# Patient Record
Sex: Female | Born: 2000 | Race: Asian | Hispanic: No | Marital: Single | Smoking: Former smoker
Health system: Southern US, Community
[De-identification: ages and names within clinical notes are randomized; demographics above are authoritative.]

## PROBLEM LIST (undated history)

## (undated) DIAGNOSIS — Z789 Other specified health status: Secondary | ICD-10-CM

## (undated) HISTORY — PX: NO PAST SURGERIES: SHX2092

---
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
## 2018-03-24 LAB — OB RESULTS CONSOLE GC/CHLAMYDIA
Chlamydia: NEGATIVE
Gonorrhea: NEGATIVE

## 2018-03-24 LAB — OB RESULTS CONSOLE RUBELLA ANTIBODY, IGM: Rubella: IMMUNE

## 2018-03-24 LAB — OB RESULTS CONSOLE ABO/RH: RH Type: POSITIVE

## 2018-03-24 LAB — OB RESULTS CONSOLE ANTIBODY SCREEN: Antibody Screen: NEGATIVE

## 2018-03-24 LAB — OB RESULTS CONSOLE HEPATITIS B SURFACE ANTIGEN: Hepatitis B Surface Ag: NEGATIVE

## 2018-03-24 LAB — OB RESULTS CONSOLE RPR: RPR: NONREACTIVE

## 2018-03-24 LAB — OB RESULTS CONSOLE HIV ANTIBODY (ROUTINE TESTING): HIV: NONREACTIVE

## 2018-04-14 ENCOUNTER — Encounter (HOSPITAL_COMMUNITY): Payer: Self-pay

## 2018-04-22 ENCOUNTER — Other Ambulatory Visit (HOSPITAL_COMMUNITY): Payer: Self-pay | Admitting: Nurse Practitioner

## 2018-04-22 DIAGNOSIS — O358XX0 Maternal care for other (suspected) fetal abnormality and damage, not applicable or unspecified: Secondary | ICD-10-CM

## 2018-04-22 DIAGNOSIS — IMO0002 Reserved for concepts with insufficient information to code with codable children: Secondary | ICD-10-CM

## 2018-04-22 DIAGNOSIS — Z3689 Encounter for other specified antenatal screening: Secondary | ICD-10-CM

## 2018-04-22 DIAGNOSIS — Z3A23 23 weeks gestation of pregnancy: Secondary | ICD-10-CM

## 2018-05-06 ENCOUNTER — Encounter (HOSPITAL_COMMUNITY): Payer: Self-pay

## 2018-05-12 ENCOUNTER — Encounter (HOSPITAL_COMMUNITY): Payer: Self-pay

## 2018-05-12 ENCOUNTER — Ambulatory Visit (HOSPITAL_COMMUNITY)
Admission: RE | Admit: 2018-05-12 | Discharge: 2018-05-12 | Disposition: A | Discharge status: Home / Self Care | Payer: Medicaid Other | Source: Ambulatory Visit | Attending: Nurse Practitioner | Admitting: Nurse Practitioner

## 2018-05-12 ENCOUNTER — Other Ambulatory Visit (HOSPITAL_COMMUNITY): Payer: Self-pay | Admitting: *Deleted

## 2018-05-12 DIAGNOSIS — Z3689 Encounter for other specified antenatal screening: Secondary | ICD-10-CM

## 2018-05-12 DIAGNOSIS — Z362 Encounter for other antenatal screening follow-up: Secondary | ICD-10-CM

## 2018-05-12 DIAGNOSIS — Z3687 Encounter for antenatal screening for uncertain dates: Secondary | ICD-10-CM | POA: Diagnosis not present

## 2018-05-12 DIAGNOSIS — O359XX0 Maternal care for (suspected) fetal abnormality and damage, unspecified, not applicable or unspecified: Secondary | ICD-10-CM

## 2018-05-12 DIAGNOSIS — O358XX0 Maternal care for other (suspected) fetal abnormality and damage, not applicable or unspecified: Secondary | ICD-10-CM

## 2018-05-12 DIAGNOSIS — Z3A23 23 weeks gestation of pregnancy: Secondary | ICD-10-CM | POA: Diagnosis not present

## 2018-05-12 DIAGNOSIS — IMO0002 Reserved for concepts with insufficient information to code with codable children: Secondary | ICD-10-CM

## 2018-06-09 ENCOUNTER — Ambulatory Visit (HOSPITAL_COMMUNITY)
Admission: RE | Admit: 2018-06-09 | Discharge: 2018-06-09 | Disposition: A | Discharge status: Home / Self Care | Payer: Medicaid Other | Source: Ambulatory Visit | Attending: Nurse Practitioner | Admitting: Nurse Practitioner

## 2018-06-09 DIAGNOSIS — O359XX0 Maternal care for (suspected) fetal abnormality and damage, unspecified, not applicable or unspecified: Secondary | ICD-10-CM

## 2018-06-09 DIAGNOSIS — Z3A27 27 weeks gestation of pregnancy: Secondary | ICD-10-CM | POA: Insufficient documentation

## 2018-06-09 DIAGNOSIS — O358XX0 Maternal care for other (suspected) fetal abnormality and damage, not applicable or unspecified: Secondary | ICD-10-CM | POA: Insufficient documentation

## 2018-06-09 DIAGNOSIS — Z362 Encounter for other antenatal screening follow-up: Secondary | ICD-10-CM

## 2018-08-07 ENCOUNTER — Other Ambulatory Visit (HOSPITAL_COMMUNITY): Payer: Self-pay | Admitting: *Deleted

## 2018-08-07 DIAGNOSIS — Z364 Encounter for antenatal screening for fetal growth retardation: Secondary | ICD-10-CM

## 2018-08-08 ENCOUNTER — Ambulatory Visit (HOSPITAL_COMMUNITY)
Admission: RE | Admit: 2018-08-08 | Discharge: 2018-08-08 | Disposition: A | Discharge status: Home / Self Care | Payer: Medicaid Other | Source: Ambulatory Visit | Attending: Nurse Practitioner | Admitting: Nurse Practitioner

## 2018-08-08 ENCOUNTER — Other Ambulatory Visit (HOSPITAL_COMMUNITY): Payer: Self-pay | Admitting: Obstetrics and Gynecology

## 2018-08-08 DIAGNOSIS — O26843 Uterine size-date discrepancy, third trimester: Secondary | ICD-10-CM | POA: Diagnosis not present

## 2018-08-08 DIAGNOSIS — Z3A36 36 weeks gestation of pregnancy: Secondary | ICD-10-CM | POA: Diagnosis not present

## 2018-08-08 DIAGNOSIS — O359XX0 Maternal care for (suspected) fetal abnormality and damage, unspecified, not applicable or unspecified: Secondary | ICD-10-CM

## 2018-08-08 DIAGNOSIS — O365930 Maternal care for other known or suspected poor fetal growth, third trimester, not applicable or unspecified: Secondary | ICD-10-CM

## 2018-08-08 DIAGNOSIS — Z364 Encounter for antenatal screening for fetal growth retardation: Secondary | ICD-10-CM | POA: Insufficient documentation

## 2018-08-08 DIAGNOSIS — Z362 Encounter for other antenatal screening follow-up: Secondary | ICD-10-CM | POA: Diagnosis not present

## 2018-08-11 ENCOUNTER — Telehealth: Payer: Self-pay | Admitting: Obstetrics and Gynecology

## 2018-08-11 ENCOUNTER — Other Ambulatory Visit (HOSPITAL_COMMUNITY): Payer: Self-pay | Admitting: *Deleted

## 2018-08-11 ENCOUNTER — Encounter: Payer: Self-pay | Admitting: Obstetrics and Gynecology

## 2018-08-11 DIAGNOSIS — O365990 Maternal care for other known or suspected poor fetal growth, unspecified trimester, not applicable or unspecified: Secondary | ICD-10-CM | POA: Insufficient documentation

## 2018-08-11 DIAGNOSIS — O365930 Maternal care for other known or suspected poor fetal growth, third trimester, not applicable or unspecified: Secondary | ICD-10-CM

## 2018-08-11 NOTE — Telephone Encounter (Signed)
OB Telephone Note Dr. Grace BushyBooker recommends IOL at any point b/w 37-39wks. [facility] Shaw [facility] and set up MD consult visit with provider for tomorrow morning to go over delivery plan. Patient already has rpt mfm bpp and dopplers for tomorrow afternoon.  Leah Copaharlie [name], [name] MD Attending Center [facility] 08/11/2018 Time: 1253pm

## 2018-08-12 ENCOUNTER — Encounter (HOSPITAL_COMMUNITY): Payer: Self-pay

## 2018-08-12 ENCOUNTER — Encounter (HOSPITAL_COMMUNITY): Payer: Self-pay | Admitting: *Deleted

## 2018-08-12 ENCOUNTER — Other Ambulatory Visit: Payer: Self-pay | Admitting: Advanced Practice Midwife

## 2018-08-12 ENCOUNTER — Ambulatory Visit (HOSPITAL_COMMUNITY)
Admission: RE | Admit: 2018-08-12 | Discharge: 2018-08-12 | Disposition: A | Discharge status: Home / Self Care | Payer: Medicaid Other | Source: Ambulatory Visit | Attending: Nurse Practitioner | Admitting: Nurse Practitioner

## 2018-08-12 ENCOUNTER — Telehealth (HOSPITAL_COMMUNITY): Payer: Self-pay | Admitting: *Deleted

## 2018-08-12 VITALS — BP 105/75 | HR 81

## 2018-08-12 DIAGNOSIS — O365930 Maternal care for other known or suspected poor fetal growth, third trimester, not applicable or unspecified: Secondary | ICD-10-CM | POA: Insufficient documentation

## 2018-08-12 DIAGNOSIS — Z3A36 36 weeks gestation of pregnancy: Secondary | ICD-10-CM | POA: Diagnosis not present

## 2018-08-12 DIAGNOSIS — O365990 Maternal care for other known or suspected poor fetal growth, unspecified trimester, not applicable or unspecified: Secondary | ICD-10-CM

## 2018-08-12 DIAGNOSIS — O26849 Uterine size-date discrepancy, unspecified trimester: Secondary | ICD-10-CM

## 2018-08-12 DIAGNOSIS — O26843 Uterine size-date discrepancy, third trimester: Secondary | ICD-10-CM | POA: Diagnosis not present

## 2018-08-12 NOTE — Procedures (Signed)
Leah Price 06/29/2001 929w4d  Fetus A Non-Stress Test Interpretation for 08/12/18  Indication: IUGR  Fetal Heart Rate A Mode: External Baseline Rate (A): 130 bpm Variability: Moderate Accelerations: 15 x 15 Decelerations: None Multiple birth?: No  Uterine Activity Mode: Toco Contraction Frequency (min): irreg UC noted Contraction Duration (sec): 60-100 Contraction Quality: Mild Resting Tone Palpated: Relaxed Resting Time: Adequate  Interpretation (Fetal Testing) Nonstress Test Interpretation: Reactive Comments: FHR tracing rev'd by Dr. Grace BushyBooker

## 2018-08-12 NOTE — Telephone Encounter (Signed)
Preadmission screen  

## 2018-08-15 ENCOUNTER — Inpatient Hospital Stay (HOSPITAL_COMMUNITY): Payer: Medicaid Other | Admitting: Anesthesiology

## 2018-08-15 ENCOUNTER — Other Ambulatory Visit (HOSPITAL_COMMUNITY): Payer: Medicaid Other

## 2018-08-15 ENCOUNTER — Inpatient Hospital Stay (HOSPITAL_COMMUNITY)
Admission: RE | Admit: 2018-08-15 | Discharge: 2018-08-17 | DRG: 807 | Disposition: A | Discharge status: Home / Self Care | Payer: Medicaid Other | Attending: Obstetrics & Gynecology | Admitting: Obstetrics & Gynecology

## 2018-08-15 ENCOUNTER — Other Ambulatory Visit: Payer: Self-pay

## 2018-08-15 ENCOUNTER — Encounter (HOSPITAL_COMMUNITY): Payer: Self-pay

## 2018-08-15 DIAGNOSIS — O365930 Maternal care for other known or suspected poor fetal growth, third trimester, not applicable or unspecified: Principal | ICD-10-CM | POA: Diagnosis present

## 2018-08-15 DIAGNOSIS — Z87891 Personal history of nicotine dependence: Secondary | ICD-10-CM

## 2018-08-15 DIAGNOSIS — O365990 Maternal care for other known or suspected poor fetal growth, unspecified trimester, not applicable or unspecified: Secondary | ICD-10-CM | POA: Diagnosis present

## 2018-08-15 DIAGNOSIS — Z8759 Personal history of other complications of pregnancy, childbirth and the puerperium: Secondary | ICD-10-CM | POA: Diagnosis present

## 2018-08-15 DIAGNOSIS — Z3A37 37 weeks gestation of pregnancy: Secondary | ICD-10-CM | POA: Diagnosis not present

## 2018-08-15 HISTORY — DX: Other specified health status: Z78.9

## 2018-08-15 LAB — CBC
HCT: 34.8 % — ABNORMAL LOW (ref 36.0–46.0)
Hemoglobin: 11.7 g/dL — ABNORMAL LOW (ref 12.0–15.0)
MCH: 28.7 pg (ref 26.0–34.0)
MCHC: 33.6 g/dL (ref 30.0–36.0)
MCV: 85.5 fL (ref 80.0–100.0)
Platelets: 255 10*3/uL (ref 150–400)
RBC: 4.07 MIL/uL (ref 3.87–5.11)
RDW: 12.3 % (ref 11.5–15.5)
WBC: 11.9 10*3/uL — AB (ref 4.0–10.5)

## 2018-08-15 LAB — TYPE AND SCREEN
ABO/RH(D): B POS
ANTIBODY SCREEN: NEGATIVE

## 2018-08-15 LAB — RPR: RPR Ser Ql: NONREACTIVE

## 2018-08-15 LAB — ABO/RH: ABO/RH(D): B POS

## 2018-08-15 LAB — GROUP B STREP BY PCR: Group B strep by PCR: NEGATIVE

## 2018-08-15 MED ORDER — SODIUM CHLORIDE 0.9 % IV SOLN
5.0000 10*6.[IU] | Freq: Once | INTRAVENOUS | Status: AC
  Administered 2018-08-15: 5 10*6.[IU] via INTRAVENOUS
  Filled 2018-08-15: qty 5

## 2018-08-15 MED ORDER — EPHEDRINE 5 MG/ML INJ
10.0000 mg | INTRAVENOUS | Status: DC | PRN
  Filled 2018-08-15: qty 2

## 2018-08-15 MED ORDER — WITCH HAZEL-GLYCERIN EX PADS: 1.0000 "application " | MEDICATED_PAD | CUTANEOUS | Status: DC | PRN

## 2018-08-15 MED ORDER — DIPHENHYDRAMINE HCL 25 MG PO CAPS: 25.0000 mg | ORAL_CAPSULE | Freq: Four times a day (QID) | ORAL | Status: DC | PRN

## 2018-08-15 MED ORDER — SIMETHICONE 80 MG PO CHEW: 80.0000 mg | CHEWABLE_TABLET | ORAL | Status: DC | PRN

## 2018-08-15 MED ORDER — LACTATED RINGERS IV SOLN
INTRAVENOUS | Status: DC
  Administered 2018-08-15 (×3): via INTRAVENOUS

## 2018-08-15 MED ORDER — DIPHENHYDRAMINE HCL 50 MG/ML IJ SOLN: 12.5000 mg | INTRAMUSCULAR | Status: DC | PRN

## 2018-08-15 MED ORDER — DIBUCAINE 1 % RE OINT: 1.0000 "application " | TOPICAL_OINTMENT | RECTAL | Status: DC | PRN

## 2018-08-15 MED ORDER — LIDOCAINE HCL (PF) 1 % IJ SOLN
30.0000 mL | INTRAMUSCULAR | Status: AC | PRN
  Administered 2018-08-15: 30 mL via SUBCUTANEOUS
  Filled 2018-08-15: qty 30

## 2018-08-15 MED ORDER — ACETAMINOPHEN 325 MG PO TABS: 650.0000 mg | ORAL_TABLET | ORAL | Status: DC | PRN

## 2018-08-15 MED ORDER — SOD CITRATE-CITRIC ACID 500-334 MG/5ML PO SOLN: 30.0000 mL | ORAL | Status: DC | PRN

## 2018-08-15 MED ORDER — OXYTOCIN 40 UNITS IN LACTATED RINGERS INFUSION - SIMPLE MED: 2.5000 [IU]/h | INTRAVENOUS | Status: DC

## 2018-08-15 MED ORDER — TERBUTALINE SULFATE 1 MG/ML IJ SOLN
0.2500 mg | Freq: Once | INTRAMUSCULAR | Status: DC | PRN
  Filled 2018-08-15: qty 1

## 2018-08-15 MED ORDER — LIDOCAINE HCL (PF) 1 % IJ SOLN
INTRAMUSCULAR | Status: DC | PRN
  Administered 2018-08-15: 8 mL via EPIDURAL

## 2018-08-15 MED ORDER — TETANUS-DIPHTH-ACELL PERTUSSIS 5-2.5-18.5 LF-MCG/0.5 IM SUSP: 0.5000 mL | Freq: Once | INTRAMUSCULAR | Status: DC

## 2018-08-15 MED ORDER — ONDANSETRON HCL 4 MG/2ML IJ SOLN: 4.0000 mg | INTRAMUSCULAR | Status: DC | PRN

## 2018-08-15 MED ORDER — ZOLPIDEM TARTRATE 5 MG PO TABS: 5.0000 mg | ORAL_TABLET | Freq: Every evening | ORAL | Status: DC | PRN

## 2018-08-15 MED ORDER — BENZOCAINE-MENTHOL 20-0.5 % EX AERO
1.0000 "application " | INHALATION_SPRAY | CUTANEOUS | Status: DC | PRN
  Administered 2018-08-16: 1 via TOPICAL
  Filled 2018-08-15: qty 56

## 2018-08-15 MED ORDER — PRENATAL MULTIVITAMIN CH
1.0000 | ORAL_TABLET | Freq: Every day | ORAL | Status: DC
  Administered 2018-08-16 – 2018-08-17 (×2): 1 via ORAL
  Filled 2018-08-15 (×2): qty 1

## 2018-08-15 MED ORDER — COCONUT OIL OIL: 1.0000 "application " | TOPICAL_OIL | Status: DC | PRN

## 2018-08-15 MED ORDER — FENTANYL 2.5 MCG/ML BUPIVACAINE 1/10 % EPIDURAL INFUSION (WH - ANES)
14.0000 mL/h | INTRAMUSCULAR | Status: DC | PRN
  Administered 2018-08-15: 14 mL/h via EPIDURAL
  Filled 2018-08-15: qty 100

## 2018-08-15 MED ORDER — PENICILLIN G 3 MILLION UNITS IVPB - SIMPLE MED: 3.0000 10*6.[IU] | INTRAVENOUS | Status: DC

## 2018-08-15 MED ORDER — IBUPROFEN 600 MG PO TABS
600.0000 mg | ORAL_TABLET | Freq: Four times a day (QID) | ORAL | Status: DC
  Administered 2018-08-16 – 2018-08-17 (×5): 600 mg via ORAL
  Filled 2018-08-15 (×7): qty 1

## 2018-08-15 MED ORDER — SENNOSIDES-DOCUSATE SODIUM 8.6-50 MG PO TABS
2.0000 | ORAL_TABLET | ORAL | Status: DC
  Administered 2018-08-15 – 2018-08-17 (×2): 2 via ORAL
  Filled 2018-08-15 (×2): qty 2

## 2018-08-15 MED ORDER — LACTATED RINGERS IV SOLN: 500.0000 mL | INTRAVENOUS | Status: DC | PRN

## 2018-08-15 MED ORDER — PHENYLEPHRINE 40 MCG/ML (10ML) SYRINGE FOR IV PUSH (FOR BLOOD PRESSURE SUPPORT)
80.0000 ug | PREFILLED_SYRINGE | INTRAVENOUS | Status: DC | PRN
  Filled 2018-08-15 (×2): qty 10

## 2018-08-15 MED ORDER — LACTATED RINGERS IV SOLN: 500.0000 mL | Freq: Once | INTRAVENOUS | Status: DC

## 2018-08-15 MED ORDER — PHENYLEPHRINE 40 MCG/ML (10ML) SYRINGE FOR IV PUSH (FOR BLOOD PRESSURE SUPPORT)
80.0000 ug | PREFILLED_SYRINGE | INTRAVENOUS | Status: DC | PRN
  Filled 2018-08-15: qty 10

## 2018-08-15 MED ORDER — MISOPROSTOL 50MCG HALF TABLET
50.0000 ug | ORAL_TABLET | ORAL | Status: DC | PRN
  Administered 2018-08-15 (×2): 50 ug via BUCCAL
  Filled 2018-08-15 (×3): qty 1

## 2018-08-15 MED ORDER — ONDANSETRON HCL 4 MG/2ML IJ SOLN: 4.0000 mg | Freq: Four times a day (QID) | INTRAMUSCULAR | Status: DC | PRN

## 2018-08-15 MED ORDER — OXYTOCIN BOLUS FROM INFUSION
500.0000 mL | Freq: Once | INTRAVENOUS | Status: AC
  Administered 2018-08-15: 500 mL via INTRAVENOUS

## 2018-08-15 MED ORDER — OXYTOCIN 40 UNITS IN LACTATED RINGERS INFUSION - SIMPLE MED
1.0000 m[IU]/min | INTRAVENOUS | Status: DC
  Administered 2018-08-15: 2 m[IU]/min via INTRAVENOUS
  Filled 2018-08-15: qty 1000

## 2018-08-15 MED ORDER — OXYCODONE-ACETAMINOPHEN 5-325 MG PO TABS: 2.0000 | ORAL_TABLET | ORAL | Status: DC | PRN

## 2018-08-15 MED ORDER — OXYCODONE-ACETAMINOPHEN 5-325 MG PO TABS: 1.0000 | ORAL_TABLET | ORAL | Status: DC | PRN

## 2018-08-15 MED ORDER — ONDANSETRON HCL 4 MG PO TABS: 4.0000 mg | ORAL_TABLET | ORAL | Status: DC | PRN

## 2018-08-15 NOTE — H&P (Addendum)
OBSTETRIC ADMISSION HISTORY AND PHYSICAL  Leah Price is a 17 y.o. female G1P0 with IUP at 3057w0d presenting for IOL due to IUGR. She reports +FMs. No LOF, VB, blurry vision, headaches, peripheral edema, or RUQ pain. She plans on breastfeeding. She requests POPs for birth control.  Dating: By L/19 --->  Estimated Date of Delivery: 09/05/18  Sono:   @[redacted]w[redacted]d , CWD, normal anatomy, cephalic presentation 12/6: efw 2114gm, <10%, ac<3%, afi 11, s/d 92%, bpp 8/8  Prenatal History/Complications: HbE Trait (asymptomatic)  Past Medical History: Past Medical History:  Diagnosis Date  . Medical history non-contributory     Past Surgical History: Past Surgical History:  Procedure Laterality Date  . NO PAST SURGERIES      Obstetrical History: OB History    Gravida  1   Para      Term      Preterm      AB      Living  0     SAB      TAB      Ectopic      Multiple      Live Births              Social History: Social History   Socioeconomic History  . Marital status: Single    Spouse name: Not on file  . Number of children: Not on file  . Years of education: Not on file  . Highest education level: Not on file  Occupational History  . Not on file  Social Needs  . Financial resource strain: Not hard at all  . Food insecurity:    Worry: Never true    Inability: Never true  . Transportation needs:    Medical: No    Non-medical: Not on file  Tobacco Use  . Smoking status: Former Games developermoker  . Smokeless tobacco: Never Used  Substance and Sexual Activity  . Alcohol use: Not Currently  . Drug use: Never  . Sexual activity: Yes    Birth control/protection: None  Lifestyle  . Physical activity:    Days per week: Not on file    Minutes per session: Not on file  . Stress: Not at all  Relationships  . Social connections:    Talks on phone: Not on file    Gets together: Not on file    Attends religious service: Not on file    Active member of club or  organization: Not on file    Attends meetings of clubs or organizations: Not on file    Relationship status: Not on file  Other Topics Concern  . Not on file  Social History Narrative  . Not on file    Family History: Family History  Problem Relation Age of Onset  . Miscarriages / [place]birth[place] Mother   . Hyperlipidemia Maternal Grandmother   . ADD / ADHD Neg Hx   . Alcohol abuse Neg Hx   . Anxiety disorder Neg Hx   . Arthritis Neg Hx   . Asthma Neg Hx   . Birth defects Neg Hx   . Cancer Neg Hx   . COPD Neg Hx   . Depression Neg Hx   . Diabetes Neg Hx   . Drug abuse Neg Hx   . Hearing loss Neg Hx   . Heart disease Neg Hx   . Hypertension Neg Hx   . Intellectual disability Neg Hx   . Kidney disease Neg Hx   . Learning disabilities Neg Hx   .  Obesity Neg Hx   . Stroke Neg Hx   . Vision loss Neg Hx   . Varicose Veins Neg Hx     Allergies: No Known Allergies  Medications Prior to Admission  Medication Sig Dispense Refill Last Dose  . Prenatal Vit w/Fe-Methylfol-FA (PNV PO) Take by mouth.   08/14/2018 at Unknown time  . ibuprofen (ADVIL,MOTRIN) 600 MG tablet Take 1 tablet (600 mg total) by mouth every 6 (six) hours as needed. (Patient not taking: Reported on 08/12/2018) 30 tablet 0 Not Taking     Review of Systems   All systems reviewed and negative except as stated in HPI  Blood pressure 121/69, pulse 86, temperature 98.1 F (36.7 C), temperature source Oral, resp. rate 18. General appearance: alert, cooperative and no distress Lungs: regular rate and effort Heart: regular rate  Abdomen: soft, non-tender Extremities: Homans sign is negative, no sign of DVT Presentation: cephalic Fetal monitoringBaseline: 140s bpm, reactive with moderate variability Uterine activity: Spontaneous, non-painful contractions q27min Dilation: Fingertip Effacement (%): 20 Station: -3 Exam by:: Barnicle RN   Prenatal labs: ABO, Rh: B/Positive/-- (07/22 0000) Antibody: Negative  (07/22 0000) Rubella: Immune (07/22 0000) RPR: Nonreactive (07/22 0000)  HBsAg: Negative (07/22 0000)  HIV: Non-reactive (07/22 0000)  GBS:  Unknown 1 hr GTT Negative  Prenatal Transfer Tool  Maternal Diabetes: No Genetic Screening: Normal Maternal Ultrasounds/Referrals: Abnormal:  Findings:   IUGR, Isolated EIF (echogenic intracardiac focus) Fetal Ultrasounds or other Referrals:  Referred to Materal Fetal Medicine  Maternal Substance Abuse:  No Significant Maternal Medications:  None Significant Maternal Lab Results: None  Results for orders placed or performed during the hospital encounter of 08/15/18 (from the past 24 hour(s))  CBC   Collection Time: 08/15/18 12:59 AM  Result Value Ref Range   WBC 11.9 (H) 4.0 - 10.5 K/uL   RBC 4.07 3.87 - 5.11 MIL/uL   Hemoglobin 11.7 (L) 12.0 - 15.0 g/dL   HCT 96.2 (L) 95.2 - 84.1 %   MCV 85.5 80.0 - 100.0 fL   MCH 28.7 26.0 - 34.0 pg   MCHC 33.6 30.0 - 36.0 g/dL   RDW 32.4 40.1 - 02.7 %   Platelets 255 150 - 400 K/uL    Patient Active Problem List   Diagnosis Date Noted  . IUGR (intrauterine growth restriction) affecting care of mother 08/15/2018  . Antepartum fetal growth retardation 08/11/2018    Assessment: Leah Price is a 17 y.o. G1P0 at [redacted]w[redacted]d here for IOL due to IUGR.  1. Labor: Not in active labor. 2. FWB: Reactive, reassuring FHT. 3. Pain: None at this time. Epidural desired later. 4. GBS: Unknown   Plan: 1. Labor: Cytotec Q4H. FB placement once cervix favorable. 2. FWB: Continue to monitor. 3. Pain: Fentanyl PRN. Epidural when requested. 4. GBS: PCR pending. PCN ordered.     Awilda Metro, MD  08/15/2018, 1:36 AM   I personally saw and evaluated the patient, performing the key elements of the service. I developed and verified the management plan that is described in the resident's/student's note, and I agree with the content with my edits above.  Cathie Beams, CNM 08/15/2018 7:19 AM

## 2018-08-15 NOTE — Anesthesia Postprocedure Evaluation (Signed)
Anesthesia Post Note  Patient: Vinnie Shaddock  Procedure(s) Performed: AN AD HOC LABOR EPIDURAL     Patient location during evaluation: Mother Baby Anesthesia Type: Epidural Level of consciousness: awake and alert and oriented Pain management: satisfactory to patient Vital Signs Assessment: post-procedure vital signs reviewed and stable Respiratory status: respiratory function stable Cardiovascular status: stable Postop Assessment: no headache, no backache, epidural receding, patient able to bend at knees, no signs of nausea or vomiting and adequate PO intake Anesthetic complications: no    Last Vitals:  Vitals:   08/15/18 1630 08/15/18 1731  BP: 113/75 113/71  Pulse: 83 83  Resp: 17 17  Temp: 36.9 C 37.1 C  SpO2:      Last Pain:  Vitals:   08/15/18 1731  TempSrc: Oral  PainSc:    Pain Goal: Patients Stated Pain Goal: 4 (08/15/18 0145)               Karleen DolphinFUSSELL,[place] Rollyson

## 2018-08-15 NOTE — Lactation Note (Addendum)
This note was copied from a baby's chart. Lactation Consultation Note  Patient Name: Leah Nena JordanSopheara Monestime ZOXWR'UToday's Date: 08/15/2018 Reason for consult: Initial assessment;Primapara;1st time breastfeeding;Infant < 6lbs;Early term 37-38.6wks  P1 mother whose infant is now 483 hours old.  This is an ETI at 37+0 weeks weighing < 6 lbs.  Reviewed the ETI with parents, emphasizing the need to feed at least 8-12 times/24 hours or sooner if baby shows cues, waking baby every 3 hours if he does not self awaken, tips to keep him awake during feeds, hand expression and finger feeding.  Explained the importance of hand washing for all, not kissing baby and allowing baby time for quiet time and STS as much as possible.  Educated parents on the need to begin pumping for stimulation and to help increase mother's milk supply.  Mother willing to begin pumping.  Initiated the DEBP and reviewed pump set up, assembly, disassembly and cleaning of pump parts.  Encouraged mother to do hand expression before/after feedings to help increase supply.  Colostrum container provided for any EBM she may obtain with pumping.  Milk storage times reviewed.  Informed parents that baby will probably have to be supplemented, at some point, with higher calorie formula and they are in agreement with this when necessary.  Mother's RN will provided instructions when needed.    Mom made aware of O/P services, breastfeeding support groups, community resources, and our phone # for post-discharge questions. Family and visitors present.  Reminded mother to call RN/LC for latch assistance at the next feed and as needed.  Mother verbalized understanding of the importance of feeding every three hours.  [facility]   Maternal Data Formula Feeding for Exclusion: No Has patient been taught Hand Expression?: Yes Does the patient have breastfeeding experience prior to this delivery?: No  Feeding    LATCH Score                    Interventions    Lactation Tools Discussed/Used Tools: Pump WIC Program: Yes Pump Review: Setup, frequency, and cleaning;Milk Storage Initiated by:: Laureen OchsBeth Keylan Costabile Date initiated:: 08/15/18   Consult Status Consult Status: Follow-up Date: 08/16/18 Follow-up type: In-patient    Dora SimsBeth R Johari Pinney 08/15/2018, 6:29 PM

## 2018-08-15 NOTE — Addendum Note (Signed)
Addendum  created 08/15/18 1826 by Graciela HusbandsFussell, Keshav Winegar O, CRNA   Charge Capture section accepted, Clinical Note Signed

## 2018-08-15 NOTE — Anesthesia Pain Management Evaluation Note (Signed)
  CRNA Pain Management Visit Note  Patient: Leah Price, 17 y.o., female  "Hello I am a [facility]. We have an anesthesia team available at all times to provide care throughout the hospital, including epidural management and anesthesia for C-section. I don't know your plan for the delivery whether it a natural birth, water birth, IV sedation, nitrous supplementation, doula or epidural, but we want to meet your pain goals."   1.Was your pain managed to your expectations on prior hospitalizations?   No prior hospitalizations  2.What is your expectation for pain management during this hospitalization?     Epidural  3.How can we help you reach that goal? epidural Record the patient's initial score and the patient's pain goal.   Pain: 4  Pain Goal: 6 [facility] wants you to be able to say your pain was always managed very well.  Verdon Ferrante 08/15/2018

## 2018-08-15 NOTE — Progress Notes (Addendum)
Vitals:   08/15/18 0715 08/15/18 0726  BP:  103/62  Pulse:  79  Resp: 18   Temp: 98 F (36.7 C)    FHR Cat 1, ctx q 1.5-2 minutes.  2nd cytotec at 0515.  Cx 1/50/-2, foley inserted by Dr. Bluford KaufmannA Pfieffer.

## 2018-08-15 NOTE — Progress Notes (Signed)
LABOR PROGRESS NOTE  Leah Price is a 17 y.o. G1P0 at 6147w0d  admitted for IOL 2/2 IUGR  Subjective: Patient is resting comfortably with FOB at bedside. She denies feeling contractions or pressure or leakage.   Objective: BP 103/66   Pulse 90   Temp 97.7 F (36.5 C) (Oral)   Resp 18   Ht 5\' 5"  (1.651 m)   Wt 63.9 kg   SpO2 100%   BMI 23.45 kg/m  or  Vitals:   08/15/18 1020 08/15/18 1030 08/15/18 1056 08/15/18 1100  BP: 109/69 109/66  103/66  Pulse: 92 75  90  Resp:   18   Temp:      TempSrc:      SpO2: 100%     Weight:      Height:        ~1115 Dilation: 5.5 Effacement (%): 50 Cervical Position: Anterior Station: 0 Presentation: Vertex Exam by:: Dr. Marita KansasAnderson FHT: baseline rate 125, moderate varibility, 15x15 acel, variable decel Toco: 2-3 minutes  Labs: Lab Results  Component Value Date   WBC 11.9 (H) 08/15/2018   HGB 11.7 (L) 08/15/2018   HCT 34.8 (L) 08/15/2018   MCV 85.5 08/15/2018   PLT 255 08/15/2018    Patient Active Problem List   Diagnosis Date Noted  . IUGR (intrauterine growth restriction) affecting care of mother 08/15/2018  . Antepartum fetal growth retardation 08/11/2018    Assessment / Plan: 17 y.o. G1P0 at 7847w0d here for IOL 2/2 IUGR  Labor: patient is comfortable. Membranes intact. Cervix is slowly progressing to 5.5cm and more abundant on pt right side. Ordering to begin pitocin at this time to augment and requested nurse to position patient on right side. Fetal Wellbeing:  Category I, occasional variables Pain Control:  Epidural in place Anticipated MOD:  vaginal  Chelsey Anderson DO 08/15/2018, 11:29 AM

## 2018-08-15 NOTE — Progress Notes (Signed)
Patient: Leah Price MRN: [id number]  GBS status: negative, IAP given NA  Patient is a 17 y.o. now G1P1 s/p NSVD at 3862w0d, who was admitted for IOL 2/2 IUGR. SROM 0h 875m prior to delivery with clear fluid.    Delivery Note At 2:46 PM a viable and healthy female was delivered via Vaginal, Spontaneous (Presentation:vertex; ROA).  APGAR: 8, 9; weight 4 lb 15 oz (2240 g).   Placenta status: delivered spontaneous, intact.  Cord: 3 vessel with the following complications: none.  Cord pH: pending  Anesthesia:  Epidural in place Episiotomy: None Lacerations:  Mild abrasions Suture Repair: none Est. Blood Loss (mL): 50  Mom to postpartum.  Baby to Couplet care / Skin to Skin.  Leah Price 08/15/2018, 4:37 PM   Head delivered ROA. No nuchal cord present. Shoulder and body delivered in usual fashion. Infant with spontaneous cry, placed on mother's abdomen, dried and bulb suctioned. Cord clamped x 2 after 1-minute delay, and cut by family member. Cord blood drawn. Placenta delivered spontaneously with gentle cord traction. Fundus firm with massage and Pitocin. Perineum inspected and found to have mild superficial abrasions which were found to be hemostatic.

## 2018-08-15 NOTE — Anesthesia Postprocedure Evaluation (Signed)
Anesthesia Post Note  Patient: Leah Price  Procedure(s) Performed: AN AD HOC LABOR EPIDURAL     Patient location during evaluation: Mother Baby Anesthesia Type: Epidural Level of consciousness: awake and alert Pain management: pain level controlled Vital Signs Assessment: post-procedure vital signs reviewed and stable Respiratory status: spontaneous breathing, nonlabored ventilation and respiratory function stable Cardiovascular status: stable Postop Assessment: no headache, no backache and epidural receding Anesthetic complications: no    Last Vitals:  Vitals:   08/15/18 1545 08/15/18 1630  BP:  113/75  Pulse:  83  Resp: 15 17  Temp:  36.9 C  SpO2:      Last Pain:  Vitals:   08/15/18 1630  TempSrc: Oral  PainSc: 0-No pain   Pain Goal: Patients Stated Pain Goal: 4 (08/15/18 0145)               Deolinda Frid

## 2018-08-15 NOTE — Anesthesia Preprocedure Evaluation (Signed)
Anesthesia Evaluation  Patient identified by MRN, date of birth, ID band Patient awake    Reviewed: Allergy & Precautions, H&P , NPO status , Patient's Chart, lab work & pertinent test results, reviewed documented beta blocker date and time   Airway Mallampati: I  TM Distance: >3 FB Neck ROM: full    Dental no notable dental hx.    Pulmonary neg pulmonary ROS, former smoker,    Pulmonary exam normal breath sounds clear to auscultation       Cardiovascular negative cardio ROS Normal cardiovascular exam Rhythm:regular Rate:Normal     Neuro/Psych negative neurological ROS  negative psych ROS   GI/Hepatic negative GI ROS, Neg liver ROS,   Endo/Other  negative endocrine ROS  Renal/GU negative Renal ROS  negative genitourinary   Musculoskeletal   Abdominal   Peds  Hematology negative hematology ROS (+)   Anesthesia Other Findings   Reproductive/Obstetrics (+) Pregnancy                             Anesthesia Physical Anesthesia Plan  ASA: II  Anesthesia Plan: Epidural   Post-op Pain Management:    Induction:   PONV Risk Score and Plan:   Airway Management Planned:   Additional Equipment:   Intra-op Plan:   Post-operative Plan:   Informed Consent: I have reviewed the patients History and Physical, chart, labs and discussed the procedure including the risks, benefits and alternatives for the proposed anesthesia with the patient or authorized representative who has indicated his/her understanding and acceptance.       Plan Discussed with:   Anesthesia Plan Comments:         Anesthesia Quick Evaluation  

## 2018-08-15 NOTE — Progress Notes (Signed)
LABOR PROGRESS NOTE  Leah Price is a 17 y.o. G1P0 at 4141w0d  admitted for IOL 2/2 IUGR  Subjective: Patient is having increased pain with contractions described as sharp.   Objective: BP 119/70   Pulse 85   Temp 97.9 F (36.6 C) (Oral)   Resp 16   Ht 5\' 5"  (1.651 m)   Wt 63.9 kg   SpO2 100%   BMI 23.45 kg/m  or  Vitals:   08/15/18 1230 08/15/18 1302 08/15/18 1330 08/15/18 1400  BP: 102/61 (!) 99/57 106/66 119/70  Pulse: 76 81 71 85  Resp: 18 16 16 16   Temp:  97.9 F (36.6 C)    TempSrc:  Oral    SpO2:      Weight:      Height:        ~1400 Dilation: 6.5 Effacement (%): 90 Cervical Position: Anterior Station: Plus 1 Presentation: Vertex Exam by:: Ferne CoeS Earl RNC FHT: baseline rate 130, moderate varibility, none acel, variable decel Toco: 1-2 minutes moderate  Labs: Lab Results  Component Value Date   WBC 11.9 (H) 08/15/2018   HGB 11.7 (L) 08/15/2018   HCT 34.8 (L) 08/15/2018   MCV 85.5 08/15/2018   PLT 255 08/15/2018    Patient Active Problem List   Diagnosis Date Noted  . IUGR (intrauterine growth restriction) affecting care of mother 08/15/2018  . Antepartum fetal growth retardation 08/11/2018    Assessment / Plan: 17 y.o. G1P0 at 2641w0d here for IOL 2/2 IUGR  Labor: progressing well on pitocin. Cervix ~6.5 Fetal Wellbeing:  Category I Pain Control:  Epidural in place and patient states is not adequate. Requesting other pain medications Anticipated MOD:  vaginal  Terry Abila DO 08/15/2018, 2:15 PM

## 2018-08-15 NOTE — Anesthesia Procedure Notes (Signed)
Epidural Patient location during procedure: OB Start time: 08/15/2018 9:50 AM End time: 08/15/2018 9:55 AM  Staffing Anesthesiologist: Bethena Midgetddono, Adarsh Mundorf, MD  Preanesthetic Checklist Completed: patient identified, site marked, surgical consent, pre-op evaluation, timeout performed, IV checked, risks and benefits discussed and monitors and equipment checked  Epidural Patient position: sitting Prep: site prepped and draped and DuraPrep Patient monitoring: continuous pulse ox and blood pressure Approach: midline Location: L3-L4 Injection technique: LOR air  Needle:  Needle type: Tuohy  Needle gauge: 17 G Needle length: 9 cm and 9 Needle insertion depth: 6 cm Catheter type: closed end flexible Catheter size: 19 Gauge Catheter at skin depth: 11 cm Test dose: negative  Assessment Events: blood not aspirated, injection not painful, no injection resistance, negative IV test and no paresthesia

## 2018-08-16 ENCOUNTER — Encounter (HOSPITAL_COMMUNITY): Payer: Self-pay

## 2018-08-16 LAB — CBC
HCT: 34.6 % — ABNORMAL LOW (ref 36.0–46.0)
Hemoglobin: 11.5 g/dL — ABNORMAL LOW (ref 12.0–15.0)
MCH: 29 pg (ref 26.0–34.0)
MCHC: 33.2 g/dL (ref 30.0–36.0)
MCV: 87.2 fL (ref 80.0–100.0)
PLATELETS: 234 10*3/uL (ref 150–400)
RBC: 3.97 MIL/uL (ref 3.87–5.11)
RDW: 12.9 % (ref 11.5–15.5)
WBC: 16.2 10*3/uL — ABNORMAL HIGH (ref 4.0–10.5)
nRBC: 0 % (ref 0.0–0.2)

## 2018-08-16 NOTE — Discharge Summary (Signed)
Postpartum Discharge Summary     Patient Name: Leah Price DOB: [birth year] MRN: [id number]  Date of admission: 08/15/2018 Delivering Provider: Cam Hai D   Date of discharge: 08/17/2018  Admitting diagnosis: INDUCTION Intrauterine pregnancy: [redacted]w[redacted]d     Secondary diagnosis:  Active Problems:   Antepartum fetal growth retardation   IUGR (intrauterine growth restriction) affecting care of mother  Additional problems: None     Discharge diagnosis: Term Pregnancy Delivered                                                                                                Post partum procedures:None  Augmentation: Cytotec and Foley Balloon  Complications: None  Hospital course:  Induction of Labor With Vaginal Delivery   17 y.o. yo G1P0 at [redacted]w[redacted]d was admitted to the hospital 08/15/2018 for induction of labor.  Indication for induction: IUGR.  Patient had an uncomplicated labor course as follows: Membrane Rupture Time/Date: 2:00 PM ,08/15/2018   Intrapartum Procedures: Episiotomy: None [1]                                         Lacerations:     Patient had delivery of a Viable infant.  Information for the patient's newborn:  Anael, Rosch [[id number]]  Delivery Method: Vag-Spont   08/15/2018  Details of delivery can be found in separate delivery note.  Patient had a routine postpartum course. Patient is discharged home 08/17/18.  Magnesium Sulfate recieved: No BMZ received: No  Physical exam  Vitals:   08/16/18 0516 08/16/18 1429 08/16/18 2217 08/17/18 0530  BP: 100/75 93/65 95/63  98/77  Pulse: 77 70 72 68  Resp: 16 16 18 16   Temp: 98.1 F (36.7 C) 97.9 F (36.6 C) 98.1 F (36.7 C) (!) 97.5 F (36.4 C)  TempSrc: Oral Oral Oral   SpO2: 99% 97% 100% 99%  Weight:      Height:       General: alert, cooperative and no distress Lochia: appropriate Uterine Fundus: firm Incision: N/A DVT Evaluation: No evidence of DVT seen on physical exam. Negative Homan's  sign. No cords or calf tenderness. No significant calf/ankle edema. Labs: Lab Results  Component Value Date   WBC 16.2 (H) 08/16/2018   HGB 11.5 (L) 08/16/2018   HCT 34.6 (L) 08/16/2018   MCV 87.2 08/16/2018   PLT 234 08/16/2018   No flowsheet data found.  Discharge instruction: per After Visit Summary and "Baby and Me Booklet".  After visit meds:  Allergies as of 08/17/2018   No Known Allergies     Medication List    TAKE these medications   ibuprofen 600 MG tablet Commonly known as:  ADVIL,MOTRIN Take 1 tablet (600 mg total) by mouth every 6 (six) hours.   PNV PO Take by mouth.       Diet: routine diet  Activity: Advance as tolerated. Pelvic rest for 6 weeks.   Outpatient follow up:4 weeks Follow up Appt:No future appointments. Follow up Visit:   Please  schedule this patient for Postpartum visit in: 4 weeks with the following provider: Any provider For C/S patients schedule nurse incision check in weeks 2 weeks: no High risk pregnancy complicated by: IUGR Delivery mode:  SVD Anticipated Birth Control:  POPs PP Procedures needed: None  Schedule Integrated BH visit: no  Newborn Data: Live born female  Birth Weight: 4 lb 15 oz (2240 g) APGAR: 8, 9  Newborn Delivery   Birth date/time:  08/15/2018 14:46:00 Delivery type:  Vaginal, Spontaneous     Baby Feeding: Breast Disposition:home with mother   08/17/2018 Gwenevere AbbotNimeka Ashlinn Hemrick, MD

## 2018-08-17 MED ORDER — IBUPROFEN 600 MG PO TABS: 600.0000 mg | ORAL_TABLET | Freq: Four times a day (QID) | ORAL | 0 refills | Status: DC

## 2018-08-17 NOTE — Lactation Note (Addendum)
This note was copied from a baby's chart. Lactation Consultation Note  Patient Name: Leah Price Leah Price: Leah Price Reason for consult: Follow-up assessment  Dad was able to demonstrate how to properly wash pump parts. Parents understand to wash pump parts after every use & sanitize once/day.   Per RN & parents, infant has been doing well with recent bottle feeds (Similac slow-flow nipple).   Mom would like me to return around 2pm to watch her pump using different size flanges. Mom's breasts are filling. Parents report that she has only obtained 5 mL so far. Mom did not know she could turn up suction on pump, but I have shown her how do so.    Leah Price, [facility] Price [facility], 12:29 PM

## 2018-08-17 NOTE — Progress Notes (Signed)
Post Partum Day 1 Subjective: no complaints, up ad lib, voiding, tolerating PO and + flatus  Objective: Blood pressure 98/77, pulse 68, temperature (!) 97.5 F (36.4 C), resp. rate 16, height 5\' 5"  (1.651 m), weight 63.9 kg, SpO2 99 %.  Physical Exam:  General: alert, cooperative, appears stated age and no distress Lochia: appropriate Uterine Fundus: firm Incision: NA DVT Evaluation: No evidence of DVT seen on physical exam.  Recent Labs    08/15/18 0059 08/16/18 0703  HGB 11.7* 11.5*  HCT 34.8* 34.6*    Assessment/Plan: Plan for discharge tomorrow   LOS: 1 days   Leah AbbotNimeka Valente Price 08/16/2018, 7:42 AM

## 2018-08-17 NOTE — Lactation Note (Addendum)
This note was copied from a baby's chart. Lactation Consultation Note  Patient Name: Boy Nena JordanSopheara Navarrette WUJWJ'XToday's Date: 08/17/2018 Reason for consult: Follow-up assessment  I asked parents if I could observe bottle feeding. Infant has been feeding with Similac slow-flow nipple (yellow ring). Immediately upon placing nipple in infant's mouth (with infant in an upright position), infant noted to have fast swallows with leaking out of sides of mouth. Parents report that infant always leaks formula out of the sides of his mouth. Pacing was attempted, but infant seemed to lose interest in feeding, even with positioning infant in side-lying incline with chin support.   A Dr. Theora GianottiBrown's "Newborn" nipple was applied to the bottle. Swallows sounded more comfortable, but even with support, infant's intake was low & there was spillage observed from side of infant's mouth. Infant's entire intake for that feeding was only 10 mL (which included taking a break for changing infant's diaper, burping, etc.).   An Nfant purple (slow-flow) nipple was obtained from the NICU. I asked that parents do next bottle feeding with the NFant nipple and observe for: spillage out of the side of mouth, loudness of swallows, and length of time to complete feeding.   Brooke, RN, [employer] [facility] RN, & L. Rafeek, NP given updates. Brooke, RN has told parents to call her so she can observe feeding with purple NFant nipple.   [facility]. [facility] [place] [facility].  Lurline HareRichey, Aimee Timmons [facility] 08/17/2018, 4:52 PM

## 2018-08-17 NOTE — Lactation Note (Signed)
This note was copied from a baby's chart. Lactation Consultation Note  Patient Name: Leah Price Reason for consult: Follow-up assessment  Mom feels more comfortable with size 21 flanges. She was able to pump almost 5 mL. [facility]. Mom has a manual pump at home. Mom has WIC.  Mom's family of origin is from [place]; FOB's family of [place] from [place].   Lurline HareRichey, Jovan Colligan [facility] Price, 1:42 PM

## 2018-08-18 ENCOUNTER — Ambulatory Visit: Payer: Self-pay

## 2018-08-18 NOTE — Lactation Note (Signed)
This note was copied from a baby's chart. Lactation Consultation Note  Patient Name: Boy Nena JordanSopheara Dovel [id number]'QToday's Date: 08/18/2018 Reason for consult: Follow-up assessment;1st time breastfeeding;Primapara;Infant < 6lbs;Early term 37-38.6wks   Follow up with mom of 2768 hour old ET infant. Infant with 6 feeds of EBM via Nfant Nipple of 10-28 ml, 5 formula feeds via bottle of 4-19 cc, 5 voids and 14 stools in the last 24 hours. Infant awake and crying in his crib, mom and dad washing pump and bottle parts. Changed infant diaper and put his goggles back in place  Mom's milk is coming in. Breasts are softening with pumping. Mom reports infant will not latch. Discussed ET infant feeding behavior with parents and enc mom to offer the breast with each feeding prior to bottle feeding. Enc mom to feed STS and to massage/compress breast with feedings.  Parents report infant is doing better on the purple Nfant nipple with less spillage. Mom reports they are pace bottle feeding infant.    Mom attempted to latch infant to the breast. Infant would take nipple in mouth and would not suck. Infant was offered a bottle of pumped breast milk and he fell asleep. He last fed 2 hours ago. Mom is pumping about every 2 hours and milk volume is increasing. Infant was placed back under phototherapy after feeding attempt.   [facility] to take home. Reviewed continuing to pump every 2-3 hours to offer infant supplement and to protect milk supply until infant can BF more efficiently. Reviewed I/O, signs of dehydration in the infant, signs infant is getting enough, engorgement prevention/treatment and breast milk storage and expression.   Offered to set up OP Lactation appt with mom, mom reports she would like to call back if she would like to follow up. Parents are hoping to be discharged today.    Maternal Data Has patient been taught Hand Expression?: Yes Does the patient have breastfeeding experience prior to  this delivery?: No  Feeding Feeding Type: Breast Fed  LATCH Score Latch: Too sleepy or reluctant, no latch achieved, no sucking elicited.  Audible Swallowing: None  Type of Nipple: Everted at rest and after stimulation  Comfort (Breast/Nipple): Filling, red/small blisters or bruises, mild/mod discomfort  Hold (Positioning): Assistance needed to correctly position infant at breast and maintain latch.  LATCH Score: 4  Interventions Interventions: Breast feeding basics reviewed;Assisted with latch;Skin to skin;Breast massage;Breast compression;Expressed milk  Lactation Tools Discussed/Used WIC Program: Yes Pump Review: Setup, frequency, and cleaning;Milk Storage Initiated by:: Reviewed and encouraged every 2-3 hours   Consult Status Consult Status: Follow-up Date: 08/19/18 Follow-up type: In-patient    Silas FloodSharon S Ahlijah Raia 08/18/2018, 11:06 AM

## 2018-08-18 NOTE — Lactation Note (Signed)
This note was copied from a baby's chart. Lactation Consultation Note  Patient Name: Boy Nena JordanSopheara Luiz BJYNW'GToday's Date: 08/18/2018 Reason for consult: Follow-up assessment;1st time breastfeeding;Primapara;Infant < 6lbs;Early term 37-38.6wks  LC Follow Up Visit:  RN [facility] to [facility] and pump money that Glenetta HewKim Richey, [facility] had provided yesterday.  Mother does not need the loaner pump since [facility] and provided her with one from their office.  I verified name and pump #[id number] and returned the pump to our storage room.  Paperwork confirmed and then put in shredder.  RN updated.   Elanah Osmanovic R Leeman Johnsey 08/18/2018, 12:15 PM

## 2018-08-19 ENCOUNTER — Ambulatory Visit: Payer: Self-pay

## 2018-08-19 ENCOUNTER — Other Ambulatory Visit (HOSPITAL_COMMUNITY): Payer: Medicaid Other

## 2018-08-19 NOTE — Lactation Note (Signed)
This note was copied from a baby's chart. Lactation Consultation Note  Patient Name: Leah Price PPIRJ'J Date: 08/19/2018 Reason for consult: Follow-up assessment;1st time breastfeeding;Early term 37-38.6wks;Infant < 6lbs;Difficult latch  Follow up and discharge lactation visit with patient. She states that her milk is coming in, and her pumping volumes have increased. She is offering the breast first every two to three hours or when baby shows hunger cues. She then provides a supplemental bottle of EBM. She is providing approximately 25-30 ml of EBM. She then pumps.  Patient [facility] her [facility]er pump. I recommended that she take her pump kit including tubing upon discharge.  Patient's breasts appeared firm and full but not uncomfortable. We reviewed how to treat engorgement and how to address other breast . feeding challenges such as plugged milk ducts.  We changed a wet diaper, and baby showed hunger cues. I assisted patient with latching baby in football hold on her left breast. Baby fed approximately 5 minutes with some good suckling sequences and then fell asleep. I showed patient how to support baby at breat and how to do gentle breast compressions and massage while feeding. Breast was not significantly softer post feeding, and patient states that baby does not soften breasts.   Feeding plan: Offer breast with hunger cues 8-12 times/day Provide EBM following (feeding guidelines for LPT provided) Pump every 2-3 hours (milk storage guidelines reviewed) Follow up with outpatient consult (order placed) See pediatrician tomorrow and reevaluate supplementation amounts  Maternal Data Does the patient have breastfeeding experience prior to this delivery?: No  Feeding Feeding Type: Breast Fed Nipple Type: Nfant Slow Flow (purple)  LATCH Score Latch: Repeated attempts needed to sustain latch, nipple held in mouth throughout feeding, stimulation needed to elicit sucking  reflex.  Audible Swallowing: A few with stimulation  Type of Nipple: Everted at rest and after stimulation  Comfort (Breast/Nipple): Filling, red/small blisters or bruises, mild/mod discomfort  Hold (Positioning): Assistance needed to correctly position infant at breast and maintain latch.  LATCH Score: 6  Interventions Interventions: Breast feeding basics reviewed;Assisted with latch;Breast massage;Hand express;Pre-pump if needed;Breast compression;Support pillows;Position options  Lactation Tools Discussed/Used Breast pump type: Double-Electric Breast Pump WIC Program: Yes Pump Review: Milk Storage;Setup, frequency, and cleaning Initiated by:: hl   Consult Status Consult Status: Complete Date: 08/19/18 Follow-up type: Out-patient    Lenore Manner 08/19/2018, 10:25 AM

## 2018-08-21 ENCOUNTER — Other Ambulatory Visit (HOSPITAL_COMMUNITY): Payer: Medicaid Other

## 2018-08-22 ENCOUNTER — Ambulatory Visit (HOSPITAL_COMMUNITY): Payer: Medicaid Other

## 2018-08-22 ENCOUNTER — Other Ambulatory Visit (HOSPITAL_COMMUNITY): Payer: Medicaid Other

## 2018-08-26 ENCOUNTER — Other Ambulatory Visit (HOSPITAL_COMMUNITY): Payer: Medicaid Other

## 2018-08-26 ENCOUNTER — Ambulatory Visit (HOSPITAL_COMMUNITY): Payer: Medicaid Other

## 2018-08-29 ENCOUNTER — Other Ambulatory Visit (HOSPITAL_COMMUNITY): Payer: Medicaid Other

## 2018-11-28 ENCOUNTER — Encounter (HOSPITAL_COMMUNITY): Payer: Self-pay

## 2019-08-11 ENCOUNTER — Other Ambulatory Visit: Payer: Self-pay

## 2019-08-11 DIAGNOSIS — Z20822 Contact with and (suspected) exposure to covid-19: Secondary | ICD-10-CM

## 2019-08-13 LAB — NOVEL CORONAVIRUS, NAA: SARS-CoV-2, NAA: NOT DETECTED

## 2019-09-04 NOTE — L&D Delivery Note (Signed)
Patient: Leah Price MRN: [id number]  GBS status: Negative, No IAP given  Patient is a 19 y.o. now G2P2002 s/p NSVD at [redacted]w[redacted]d, who was admitted for IOL 2/2 FGR. AROM 0h 4m prior to delivery with clear-bloody fluid.    Delivery Note At 4:33 PM a viable female was delivered via Vaginal, Spontaneous (Presentation: Left Occiput Anterior).  APGAR: 7, 9; weight see delivery summary.   Placenta status: Spontaneous;Pathology, Intact. Cord: 3 vessels with the following complications: Short.  Cord pH: cord gases not collected  Head delivered LOA. No nuchal cord present. Shoulder and body delivered in usual fashion. Body cord present reduced due to short cord length. Infant with spontaneous cry, placed on mother's abdomen, dried and bulb suctioned. Cord clamped x 2 after 1-minute delay, and cut by family member. Cord blood drawn. Placenta delivered spontaneously with gentle cord traction. Fundus firm with massage and Pitocin. Perineum inspected and found to have no lacerations.  Anesthesia: Epidural Episiotomy: None Lacerations: None Suture Repair: N/A Est. Blood Loss (mL): 75  Mom to postpartum.  Baby to Couplet care / Skin to Skin.  Denario Bagot Autry-Lott 07/31/2020, 4:52 PM

## 2019-09-24 ENCOUNTER — Other Ambulatory Visit: Payer: Self-pay

## 2019-09-24 ENCOUNTER — Emergency Department (HOSPITAL_COMMUNITY)
Admission: EM | Admit: 2019-09-24 | Discharge: 2019-09-24 | Disposition: A | Discharge status: Home / Self Care | Payer: Medicaid Other | Attending: Emergency Medicine | Admitting: Emergency Medicine

## 2019-09-24 ENCOUNTER — Encounter (HOSPITAL_COMMUNITY): Payer: Self-pay

## 2019-09-24 DIAGNOSIS — R21 Rash and other nonspecific skin eruption: Secondary | ICD-10-CM | POA: Diagnosis present

## 2019-09-24 DIAGNOSIS — L2082 Flexural eczema: Secondary | ICD-10-CM | POA: Diagnosis not present

## 2019-09-24 DIAGNOSIS — Z87891 Personal history of nicotine dependence: Secondary | ICD-10-CM | POA: Insufficient documentation

## 2019-09-24 MED ORDER — PREDNISONE 10 MG (21) PO TBPK: ORAL_TABLET | Freq: Every day | ORAL | 0 refills | Status: DC

## 2019-09-24 MED ORDER — TRIAMCINOLONE ACETONIDE 0.1 % EX CREA: 1.0000 "application " | TOPICAL_CREAM | Freq: Two times a day (BID) | CUTANEOUS | 0 refills | Status: DC

## 2019-09-24 NOTE — ED Notes (Signed)
An After Visit Summary was printed and given to the patient. Discharge instructions given and no further questions at this time.  

## 2019-09-24 NOTE — ED Triage Notes (Signed)
Pt has redness on her face that has not been resolved with steroids or cream given to her by [facility]. Pt states that she has tried to contact the dermatology referral, but has not gotten an answer.

## 2019-09-24 NOTE — Discharge Instructions (Signed)
You were seen in the ER for rash  Based on your symptoms and exam I think you have eczema  This typically causes a red, itchy dry rash  Avoid excessive bathing or soaking, cold exposure and any other topical products are highly fragrance  Use lukewarm water to shower/bathe.  Apply thick moisturizing cream to affected areas immediately after showering and as needed throughout the day.  Use prescribed triamcinolone cream on very dry areas.  This cream can be used on the neck, hands.  Use very sparingly on the face as steroid creams can cause thinning and whitening of the skin and should technically be avoided on the face  For more skin protection you can use Vaseline/Aquaphor ointment  Have prescribed a burst of steroids that can also help with the acute flare  Follow-up with dermatology for further treatment of this  Return to the ER for fever, worsening or persistent rash, throat or tongue swelling, chest pain or shortness of breath, tongue or throat lesions

## 2019-09-24 NOTE — ED Provider Notes (Signed)
[facility]-EMERGENCY DEPT Provider Note   CSN: [id number] Arrival date & time: 09/24/19  1024     History Chief Complaint  Patient presents with  . Rash    Leah Price is a 19 y.o. female presents to the ER for evaluation of rash.  Described as itchy, red, dry and burning.  Has had this rash before 3 years ago but this returned a few months ago when she started having to wear a mask due to COVID-19 pandemic.  The rash started around the corner of her mouth and nose.  This has since worsened across her cheeks, eyelids, ears, neck.  Over the last few days he has noticed similar rash on the top of her hands.  Was once told it was eczema.  Her son has eczema.  Went to urgent care recently and was given clotrimazole cream and states this did not help.  Denies any recent medicines.  Denies any known allergies.  Denies any changes to topical products, detergents, pet exposure.  Denies fever, tongue or throat lesions, sore throat.  No recent illness.  No chest pain or shortness of breath, vomiting.  HPI     Past Medical History:  Diagnosis Date  . Medical history non-contributory     Patient Active Problem List   Diagnosis Date Noted  . IUGR (intrauterine growth restriction) affecting care of mother 08/15/2018  . Antepartum fetal growth retardation 08/11/2018    Past Surgical History:  Procedure Laterality Date  . NO PAST SURGERIES       OB History    Gravida  1   Para      Term      Preterm      AB      Living  0     SAB      TAB      Ectopic      Multiple      Live Births              Family History  Problem Relation Age of Onset  . Miscarriages / [place] Mother   . Hyperlipidemia Maternal Grandmother   . ADD / ADHD Neg Hx   . Alcohol abuse Neg Hx   . Anxiety disorder Neg Hx   . Arthritis Neg Hx   . Asthma Neg Hx   . Birth defects Neg Hx   . Cancer Neg Hx   . COPD Neg Hx   . Depression Neg Hx   . Diabetes Neg Hx   .  Drug abuse Neg Hx   . Hearing loss Neg Hx   . Heart disease Neg Hx   . Hypertension Neg Hx   . Intellectual disability Neg Hx   . Kidney disease Neg Hx   . Learning disabilities Neg Hx   . Obesity Neg Hx   . Stroke Neg Hx   . Vision loss Neg Hx   . Varicose Veins Neg Hx     Social History   Tobacco Use  . Smoking status: Former Games developer  . Smokeless tobacco: Never Used  Substance Use Topics  . Alcohol use: Not Currently  . Drug use: Never    Home Medications Prior to Admission medications   Medication Sig Start Date End Date Taking? Authorizing Provider  ibuprofen (ADVIL,MOTRIN) 600 MG tablet Take 1 tablet (600 mg total) by mouth every 6 (six) hours. 08/17/18   Gwenevere Abbot, MD  predniSONE (STERAPRED UNI-PAK 21 TAB) 10 MG (21) TBPK tablet Take by  mouth daily. Take 6 tabs by mouth daily  for 2 days, then 5 tabs for 2 days, then 4 tabs for 2 days, then 3 tabs for 2 days, 2 tabs for 2 days, then 1 tab by mouth daily for 2 days 09/24/19   Kinnie Feil, PA-C  Prenatal Vit w/Fe-Methylfol-FA (PNV PO) Take by mouth.    [provider]  triamcinolone cream (KENALOG) 0.1 % Apply 1 application topically 2 (two) times daily. 09/24/19   Kinnie Feil, PA-C    Allergies    Patient has no known allergies.  Review of Systems   Review of Systems  Skin: Positive for rash.  All other systems reviewed and are negative.   Physical Exam Updated Vital Signs BP 119/79   Pulse 82   Temp 98.5 F (36.9 C) (Oral)   Resp 16   SpO2 100%   Physical Exam Vitals and nursing note reviewed.  Constitutional:      General: She is not in acute distress.    Appearance: She is well-developed.     Comments: NAD.  HENT:     Head: Normocephalic and atraumatic.     Right Ear: External ear normal.     Left Ear: External ear normal.     Nose: Nose normal.  Eyes:     General: No scleral icterus.    Conjunctiva/sclera: Conjunctivae normal.  Cardiovascular:     Rate and Rhythm:  Normal rate and regular rhythm.     Heart sounds: Normal heart sounds.  Pulmonary:     Effort: Pulmonary effort is normal.     Breath sounds: Normal breath sounds.  Musculoskeletal:        General: No deformity. Normal range of motion.     Cervical back: Normal range of motion and neck supple.  Skin:    General: Skin is warm and dry.     Capillary Refill: Capillary refill takes less than 2 seconds.     Comments: Non tender, diffuse erythematous, slightly raised dry/scaly patchy rash in corners of mouth, around nose, bilateral upper eyelids, ears and ear creases below ear lobules. Similar large patch anterior neck creases, dorsal hands bilaterally. No vesicular lesions.   Neurological:     Mental Status: She is alert and oriented to person, place, and time.  Psychiatric:        Behavior: Behavior normal.        Thought Content: Thought content normal.        Judgment: Judgment normal.     ED Results / Procedures / Treatments   Labs (all labs ordered are listed, but only abnormal results are displayed) Labs Reviewed - No data to display  EKG None  Radiology No results found.  Procedures Procedures (including critical care time)  Medications Ordered in ED Medications - No data to display  ED Course  I have reviewed the triage vital signs and the nursing notes.  Pertinent labs & imaging results that were available during my care of the patient were reviewed by me and considered in my medical decision making (see chart for details).    MDM Rules/Calculators/A&P                      Location, appearance of rash most suggestive of eczema.  Her son has eczema.   No facial or oral angioedema.  No respiratory compromise.  No recent exposure to any possible allergen.  No new topical hygiene products used.  No new medications.  No evidence of animal bite wounds.  No fluctuance, induration that would suggest abscess or cellulitis. Doubt  dermatological emergency including EM, SJS,  TEN or meningococcemia.  Patient otherwise feels well and at baseline, no URI or viral type systemic symptoms that would suggest viral exanthem.  Will be discharged with benadryl, steroids, triamcinolone cream.  Supportive management at home including luke warm baths, moisturizing creams/ointments discussed.  Return precautions given.  Encoureaged PCP f/u to obtain formal referral for dermatology.   Final Clinical Impression(s) / ED Diagnoses Final diagnoses:  Flexural eczema    Rx / DC Orders ED Discharge Orders         Ordered    predniSONE (STERAPRED UNI-PAK 21 TAB) 10 MG (21) TBPK tablet  Daily     09/24/19 1148    triamcinolone cream (KENALOG) 0.1 %  2 times daily     09/24/19 1148           Jerrell Mylar 09/24/19 1223    Terrilee Files, MD 09/24/19 [phone]

## 2019-10-06 ENCOUNTER — Ambulatory Visit: Payer: Medicaid Other | Attending: Internal Medicine

## 2019-10-06 DIAGNOSIS — Z20822 Contact with and (suspected) exposure to covid-19: Secondary | ICD-10-CM

## 2019-10-07 LAB — NOVEL CORONAVIRUS, NAA: SARS-CoV-2, NAA: NOT DETECTED

## 2020-02-04 ENCOUNTER — Ambulatory Visit: Payer: Medicaid Other

## 2020-02-04 DIAGNOSIS — Z3480 Encounter for supervision of other normal pregnancy, unspecified trimester: Secondary | ICD-10-CM | POA: Insufficient documentation

## 2020-02-04 MED ORDER — BLOOD PRESSURE KIT DEVI: 1.0000 | 0 refills | Status: DC | PRN

## 2020-02-04 NOTE — Progress Notes (Signed)
..    Virtual Visit via Telephone Note  I connected with Leah Price on 02/04/20 at  2:00 PM EDT by telephone and verified that I am speaking with the correct person using two identifiers.  Location: [facility]  I discussed the limitations, risks, security and privacy concerns of performing an evaluation and management service by telephone and the availability of in person appointments. I also discussed with the patient that there may be a patient responsible charge related to this service. The patient expressed understanding and agreed to proceed.   History of Present Illness: PRENATAL INTAKE SUMMARY  Leah Price presents today New OB Nurse Interview.  OB History    Gravida  2   Para  1   Term  1   Preterm      AB      Living  1     SAB      TAB      Ectopic      Multiple      Live Births  1          I have reviewed the patient's medical, obstetrical, social, and family histories, medications, and available lab results.  SUBJECTIVE She has no unusual complaints   Observations/Objective: Initial nurse interview for history/labs (New OB)  EDD: 08-21-20 GA: [redacted]w[redacted]d GP: G2P0  GENERAL APPEARANCE: alert, well appearing  Assessment and Plan: Normal pregnancy Pt reports no history of pre-existing medical conditions, and no complications in previous pregnancy.  BP cuff sent to [facility] and babyrx sent to e-mail. NOB appt scheduled for 02-10-20  Follow Up Instructions:   I discussed the assessment and treatment plan with the patient. The patient was provided an opportunity to ask questions and all were answered. The patient agreed with the plan and demonstrated an understanding of the instructions.   The patient was advised to call back or seek an in-person evaluation if the symptoms worsen or if the condition fails to improve as anticipated.  I provided 15 minutes of non-face-to-face time during this encounter.   Katrina Stack, RN

## 2020-02-10 ENCOUNTER — Other Ambulatory Visit (HOSPITAL_COMMUNITY)
Admission: RE | Admit: 2020-02-10 | Discharge: 2020-02-10 | Disposition: A | Discharge status: Home / Self Care | Payer: Medicaid Other | Source: Ambulatory Visit | Attending: Obstetrics & Gynecology | Admitting: Obstetrics & Gynecology

## 2020-02-10 ENCOUNTER — Other Ambulatory Visit: Payer: Self-pay

## 2020-02-10 ENCOUNTER — Ambulatory Visit (INDEPENDENT_AMBULATORY_CARE_PROVIDER_SITE_OTHER): Payer: Medicaid Other | Admitting: Obstetrics & Gynecology

## 2020-02-10 DIAGNOSIS — Z3A12 12 weeks gestation of pregnancy: Secondary | ICD-10-CM

## 2020-02-10 DIAGNOSIS — Z3482 Encounter for supervision of other normal pregnancy, second trimester: Secondary | ICD-10-CM

## 2020-02-10 DIAGNOSIS — Z3481 Encounter for supervision of other normal pregnancy, first trimester: Secondary | ICD-10-CM | POA: Diagnosis not present

## 2020-02-10 DIAGNOSIS — Z3480 Encounter for supervision of other normal pregnancy, unspecified trimester: Secondary | ICD-10-CM | POA: Diagnosis not present

## 2020-02-10 MED ORDER — ONDANSETRON 4 MG PO TBDP
4.0000 mg | ORAL_TABLET | Freq: Three times a day (TID) | ORAL | 1 refills | Status: DC | PRN
Start: 2020-02-10 — End: 2020-06-14

## 2020-02-10 NOTE — Patient Instructions (Signed)
First Trimester of Pregnancy The first trimester of pregnancy is from week 1 until the end of week 13 (months 1 through 3). A week after a sperm fertilizes an egg, the egg will implant on the wall of the uterus. This embryo will begin to develop into a baby. Genes from you and your partner will form the baby. The female genes will determine whether the baby will be a boy or a girl. At 6-8 weeks, the eyes and face will be formed, and the heartbeat can be seen on ultrasound. At the end of 12 weeks, all the baby's organs will be formed. Now that you are pregnant, you will want to do everything you can to have a healthy baby. Two of the most important things are to get good prenatal care and to follow your health care provider's instructions. Prenatal care is all the medical care you receive before the baby's birth. This care will help prevent, find, and treat any problems during the pregnancy and childbirth. Body changes during your first trimester Your body goes through many changes during pregnancy. The changes vary from woman to woman.  You may gain or lose a couple of pounds at first.  You may feel sick to your stomach (nauseous) and you may throw up (vomit). If the vomiting is uncontrollable, call your health care provider.  You may tire easily.  You may develop headaches that can be relieved by medicines. All medicines should be approved by your health care provider.  You may urinate more often. Painful urination may mean you have a bladder infection.  You may develop heartburn as a result of your pregnancy.  You may develop constipation because certain hormones are causing the muscles that push stool through your intestines to slow down.  You may develop hemorrhoids or swollen veins (varicose veins).  Your breasts may begin to grow larger and become tender. Your nipples may stick out more, and the tissue that surrounds them (areola) may become darker.  Your gums may bleed and may be  sensitive to brushing and flossing.  Dark spots or blotches (chloasma, mask of pregnancy) may develop on your face. This will likely fade after the baby is born.  Your menstrual periods will stop.  You may have a loss of appetite.  You may develop cravings for certain kinds of food.  You may have changes in your emotions from day to day, such as being excited to be pregnant or being concerned that something may go wrong with the pregnancy and baby.  You may have more vivid and strange dreams.  You may have changes in your hair. These can include thickening of your hair, rapid growth, and changes in texture. Some women also have hair loss during or after pregnancy, or hair that feels dry or thin. Your hair will most likely return to normal after your baby is born. What to expect at prenatal visits During a routine prenatal visit:  You will be weighed to make sure you and the baby are growing normally.  Your blood pressure will be taken.  Your abdomen will be measured to track your baby's growth.  The fetal heartbeat will be listened to between weeks 10 and 14 of your pregnancy.  Test results from any previous visits will be discussed. Your health care provider may ask you:  How you are feeling.  If you are feeling the baby move.  If you have had any abnormal symptoms, such as leaking fluid, bleeding, severe headaches, or abdominal   cramping.  If you are using any tobacco products, including cigarettes, chewing tobacco, and electronic cigarettes.  If you have any questions. Other tests that may be performed during your first trimester include:  Blood tests to find your blood type and to check for the presence of any previous infections. The tests will also be used to check for low iron levels (anemia) and protein on red blood cells (Rh antibodies). Depending on your risk factors, or if you previously had diabetes during pregnancy, you may have tests to check for high blood sugar  that affects pregnant women (gestational diabetes).  Urine tests to check for infections, diabetes, or protein in the urine.  An ultrasound to confirm the proper growth and development of the baby.  Fetal screens for spinal cord problems (spina bifida) and Down syndrome.  HIV (human immunodeficiency virus) testing. Routine prenatal testing includes screening for HIV, unless you choose not to have this test.  You may need other tests to make sure you and the baby are doing well. Follow these instructions at home: Medicines  Follow your health care provider's instructions regarding medicine use. Specific medicines may be either safe or unsafe to take during pregnancy.  Take a prenatal vitamin that contains at least 600 micrograms (mcg) of folic acid.  If you develop constipation, try taking a stool softener if your health care provider approves. Eating and drinking   Eat a balanced diet that includes fresh fruits and vegetables, whole grains, good sources of protein such as meat, eggs, or tofu, and low-fat dairy. Your health care provider will help you determine the amount of weight gain that is right for you.  Avoid raw meat and uncooked cheese. These carry germs that can cause birth defects in the baby.  Eating four or five small meals rather than three large meals a day may help relieve nausea and vomiting. If you start to feel nauseous, eating a few soda crackers can be helpful. Drinking liquids between meals, instead of during meals, also seems to help ease nausea and vomiting.  Limit foods that are high in fat and processed sugars, such as fried and sweet foods.  To prevent constipation: ? Eat foods that are high in fiber, such as fresh fruits and vegetables, whole grains, and beans. ? Drink enough fluid to keep your urine clear or pale yellow. Activity  Exercise only as directed by your health care provider. Most women can continue their usual exercise routine during  pregnancy. Try to exercise for 30 minutes at least 5 days a week. Exercising will help you: ? Control your weight. ? Stay in shape. ? Be prepared for labor and delivery.  Experiencing pain or cramping in the lower abdomen or lower back is a good sign that you should stop exercising. Check with your health care provider before continuing with normal exercises.  Try to avoid standing for long periods of time. Move your legs often if you must stand in one place for a long time.  Avoid heavy lifting.  Wear low-heeled shoes and practice good posture.  You may continue to have sex unless your health care provider tells you not to. Relieving pain and discomfort  Wear a good support bra to relieve breast tenderness.  Take warm sitz baths to soothe any pain or discomfort caused by hemorrhoids. Use hemorrhoid cream if your health care provider approves.  Rest with your legs elevated if you have leg cramps or low back pain.  If you develop varicose veins in   your legs, wear support hose. Elevate your feet for 15 minutes, 3-4 times a day. Limit salt in your diet. Prenatal care  Schedule your prenatal visits by the twelfth week of pregnancy. They are usually scheduled monthly at first, then more often in the last 2 months before delivery.  Write down your questions. Take them to your prenatal visits.  Keep all your prenatal visits as told by your health care provider. This is important. Safety  Wear your seat belt at all times when driving.  Make a list of emergency phone numbers, including numbers for family, friends, the hospital, and police and fire departments. General instructions  Ask your health care provider for a referral to a local prenatal education class. Begin classes no later than the beginning of month 6 of your pregnancy.  Ask for help if you have counseling or nutritional needs during pregnancy. Your health care provider can offer advice or refer you to specialists for help  with various needs.  Do not use hot tubs, steam rooms, or saunas.  Do not douche or use tampons or scented sanitary pads.  Do not cross your legs for long periods of time.  Avoid cat litter boxes and soil used by cats. These carry germs that can cause birth defects in the baby and possibly loss of the fetus by miscarriage or stillbirth.  Avoid all smoking, herbs, alcohol, and medicines not prescribed by your health care provider. Chemicals in these products affect the formation and growth of the baby.  Do not use any products that contain nicotine or tobacco, such as cigarettes and e-cigarettes. If you need help quitting, ask your health care provider. You may receive counseling support and other resources to help you quit.  Schedule a dentist appointment. At home, brush your teeth with a soft toothbrush and be gentle when you floss. Contact a health care provider if:  You have dizziness.  You have mild pelvic cramps, pelvic pressure, or nagging pain in the abdominal area.  You have persistent nausea, vomiting, or diarrhea.  You have a bad smelling vaginal discharge.  You have pain when you urinate.  You notice increased swelling in your face, hands, legs, or ankles.  You are exposed to fifth disease or chickenpox.  You are exposed to German measles (rubella) and have never had it. Get help right away if:  You have a fever.  You are leaking fluid from your vagina.  You have spotting or bleeding from your vagina.  You have severe abdominal cramping or pain.  You have rapid weight gain or loss.  You vomit blood or material that looks like coffee grounds.  You develop a severe headache.  You have shortness of breath.  You have any kind of trauma, such as from a fall or a car accident. Summary  The first trimester of pregnancy is from week 1 until the end of week 13 (months 1 through 3).  Your body goes through many changes during pregnancy. The changes vary from  woman to woman.  You will have routine prenatal visits. During those visits, your health care provider will examine you, discuss any test results you may have, and talk with you about how you are feeling. This information is not intended to replace advice given to you by your health care provider. Make sure you discuss any questions you have with your health care provider. Document Revised: 08/02/2017 Document Reviewed: 08/01/2016 Elsevier Patient Education  2020 [employer]  

## 2020-02-10 NOTE — Progress Notes (Signed)
  Subjective:    Leah Price is a G2P1001 [redacted]w[redacted]d being seen today for her first obstetrical visit.  Her obstetrical history is significant for intrauterine growth restriction (IUGR). Patient does intend to breast feed. Pregnancy history fully reviewed.  Patient reports headache, nausea and vomiting.  Vitals:   02/10/20 1014  BP: 95/65  Pulse: 80    HISTORY: OB History  Gravida Para Term Preterm AB Living  2 1 1     1   SAB TAB Ectopic Multiple Live Births          1    # Outcome Date GA Lbr Len/2nd Weight Sex Delivery Anes PTL Lv  2 Current           1 Term 08/15/18 [redacted]w[redacted]d    Vag-Spont   LIV   Past Medical History:  Diagnosis Date  . Medical history non-contributory    Past Surgical History:  Procedure Laterality Date  . NO PAST SURGERIES     Family History  Problem Relation Age of Onset  . Miscarriages / [redacted]w[redacted]d Mother   . Hyperlipidemia Maternal Grandmother   . ADD / ADHD Neg Hx   . Alcohol abuse Neg Hx   . Anxiety disorder Neg Hx   . Arthritis Neg Hx   . Asthma Neg Hx   . Birth defects Neg Hx   . Cancer Neg Hx   . COPD Neg Hx   . Depression Neg Hx   . Diabetes Neg Hx   . Drug abuse Neg Hx   . Hearing loss Neg Hx   . Heart disease Neg Hx   . Hypertension Neg Hx   . Intellectual disability Neg Hx   . Kidney disease Neg Hx   . Learning disabilities Neg Hx   . Obesity Neg Hx   . Stroke Neg Hx   . Vision loss Neg Hx   . Varicose Veins Neg Hx      Exam    System: Breast:  not examined   Skin: normal coloration and turgor, no rashes    Neurologic: normal mood   Extremities: normal strength, tone, and muscle mass   HEENT PERRLA   Cardiovascular: regular rate and rhythm, no murmurs or gallops   Respiratory:  appears well, vitals normal, no respiratory distress, acyanotic, normal RR, ear and throat exam is normal, neck free of mass or lymphadenopathy, chest clear, no wheezing, crepitations, rhonchi, normal symmetric air entry   Abdomen: soft,  non-tender; bowel sounds normal; no masses,  no organomegaly      Assessment:    Pregnancy: G2P1001 Patient Active Problem List   Diagnosis Date Noted  . Supervision of other normal pregnancy, antepartum 02/04/2020  . IUGR (intrauterine growth restriction) affecting care of mother 08/15/2018  . Antepartum fetal growth retardation 08/11/2018       Plan:     Initial labs drawn. Prenatal vitamins. Problem list reviewed and updated. Genetic Screening discussed Integrated Screen: requested.  Ultrasound discussed; fetal survey: requested.  Follow up in 4 weeks. 85% of 15 min visit spent on counseling and coordination of care.  SAB, bleeding precautions given.    14/05/2018 02/10/2020

## 2020-02-10 NOTE — Progress Notes (Signed)
NEW OB.  C/o light headedness and blurred vision, headaches 6-7/10 x 1 month.

## 2020-02-11 LAB — CBC/D/PLT+RPR+RH+ABO+RUB AB...
Antibody Screen: NEGATIVE
Basophils Absolute: 0 10*3/uL (ref 0.0–0.2)
Basos: 1 %
EOS (ABSOLUTE): 0.3 10*3/uL (ref 0.0–0.4)
Eos: 3 %
HCV Ab: <0.1 s/co ratio (ref 0.0–0.9)
HIV Screen 4th Generation wRfx: NONREACTIVE
Hematocrit: 37.3 % (ref 34.0–46.6)
Hemoglobin: 11.6 g/dL (ref 11.1–15.9)
Hepatitis B Surface Ag: NEGATIVE
Immature Grans (Abs): 0 10*3/uL (ref 0.0–0.1)
Immature Granulocytes: 0 %
Lymphocytes Absolute: 1.8 10*3/uL (ref 0.7–3.1)
Lymphs: 21 %
MCH: 27.3 pg (ref 26.6–33.0)
MCHC: 31.1 g/dL — ABNORMAL LOW (ref 31.5–35.7)
MCV: 88 fL (ref 79–97)
Monocytes Absolute: 0.7 10*3/uL (ref 0.1–0.9)
Monocytes: 8 %
Neutrophils Absolute: 5.9 10*3/uL (ref 1.4–7.0)
Neutrophils: 67 %
Platelets: 310 10*3/uL (ref 150–450)
RBC: 4.25 x10E6/uL (ref 3.77–5.28)
RDW: 12.3 % (ref 11.7–15.4)
RPR Ser Ql: NONREACTIVE
Rh Factor: POSITIVE
Rubella Antibodies, IGG: <0.9 index — ABNORMAL LOW (ref >0.99)
WBC: 8.7 10*3/uL (ref 3.4–10.8)

## 2020-02-11 LAB — CERVICOVAGINAL ANCILLARY ONLY
Bacterial Vaginitis (gardnerella): NEGATIVE
Candida Glabrata: NEGATIVE
Candida Vaginitis: NEGATIVE
Chlamydia: NEGATIVE
Comment: NEGATIVE
Comment: NEGATIVE
Comment: NEGATIVE
Comment: NEGATIVE
Comment: NEGATIVE
Comment: NORMAL
Neisseria Gonorrhea: NEGATIVE
Trichomonas: NEGATIVE

## 2020-02-11 LAB — HCV INTERPRETATION

## 2020-02-13 LAB — URINE CULTURE, OB REFLEX

## 2020-02-13 LAB — CULTURE, OB URINE

## 2020-02-15 ENCOUNTER — Encounter: Payer: Self-pay | Admitting: Obstetrics and Gynecology

## 2020-02-15 ENCOUNTER — Other Ambulatory Visit: Payer: Self-pay

## 2020-02-18 ENCOUNTER — Encounter: Payer: Self-pay | Admitting: Obstetrics and Gynecology

## 2020-02-18 ENCOUNTER — Other Ambulatory Visit: Payer: Self-pay

## 2020-02-19 ENCOUNTER — Encounter: Payer: Self-pay | Admitting: Obstetrics & Gynecology

## 2020-02-19 DIAGNOSIS — D563 Thalassemia minor: Secondary | ICD-10-CM

## 2020-02-19 DIAGNOSIS — D582 Other hemoglobinopathies: Secondary | ICD-10-CM

## 2020-02-19 DIAGNOSIS — Z148 Genetic carrier of other disease: Secondary | ICD-10-CM | POA: Insufficient documentation

## 2020-02-19 HISTORY — DX: Other hemoglobinopathies: D58.2

## 2020-02-19 HISTORY — DX: Genetic carrier of other disease: Z14.8

## 2020-02-19 HISTORY — DX: Thalassemia minor: D56.3

## 2020-03-02 ENCOUNTER — Ambulatory Visit (INDEPENDENT_AMBULATORY_CARE_PROVIDER_SITE_OTHER): Payer: Medicaid Other

## 2020-03-02 ENCOUNTER — Other Ambulatory Visit (HOSPITAL_COMMUNITY)
Admission: RE | Admit: 2020-03-02 | Discharge: 2020-03-02 | Disposition: A | Discharge status: Home / Self Care | Payer: Medicaid Other | Source: Ambulatory Visit | Attending: Obstetrics | Admitting: Obstetrics

## 2020-03-02 ENCOUNTER — Other Ambulatory Visit: Payer: Self-pay

## 2020-03-02 DIAGNOSIS — N898 Other specified noninflammatory disorders of vagina: Secondary | ICD-10-CM | POA: Insufficient documentation

## 2020-03-02 DIAGNOSIS — N899 Noninflammatory disorder of vagina, unspecified: Secondary | ICD-10-CM

## 2020-03-02 NOTE — Progress Notes (Signed)
SUBJECTIVE:  19 y.o. female complains of vaginitis and vaginal discharge. Denies abnormal vaginal bleeding or significant pelvic pain or fever. No UTI symptoms. Denies history of known exposure to STD.  Patient's last menstrual period was 11/15/2019.  OBJECTIVE:  She appears well, afebrile. Urine dipstick: not done.  ASSESSMENT:  Vaginal Discharge     PLAN:  BVAG, CVAG probe sent to lab. Treatment: To be determined once lab results are received ROV prn if symptoms persist or worsen.

## 2020-03-03 DIAGNOSIS — B9689 Other specified bacterial agents as the cause of diseases classified elsewhere: Secondary | ICD-10-CM

## 2020-03-03 LAB — CERVICOVAGINAL ANCILLARY ONLY
Bacterial Vaginitis (gardnerella): POSITIVE — AB
Candida Glabrata: NEGATIVE
Candida Vaginitis: NEGATIVE
Comment: NEGATIVE
Comment: NEGATIVE
Comment: NEGATIVE

## 2020-03-04 MED ORDER — METRONIDAZOLE 0.75 % VA GEL: 1.0000 | Freq: Every day | VAGINAL | 0 refills | Status: DC

## 2020-03-09 ENCOUNTER — Ambulatory Visit (INDEPENDENT_AMBULATORY_CARE_PROVIDER_SITE_OTHER): Payer: Medicaid Other | Admitting: Obstetrics and Gynecology

## 2020-03-09 ENCOUNTER — Encounter: Payer: Self-pay | Admitting: Obstetrics and Gynecology

## 2020-03-09 ENCOUNTER — Other Ambulatory Visit: Payer: Self-pay

## 2020-03-09 VITALS — BP 100/68 | HR 72 | Wt 115.0 lb

## 2020-03-09 DIAGNOSIS — Z3480 Encounter for supervision of other normal pregnancy, unspecified trimester: Secondary | ICD-10-CM

## 2020-03-09 DIAGNOSIS — Z3482 Encounter for supervision of other normal pregnancy, second trimester: Secondary | ICD-10-CM

## 2020-03-09 DIAGNOSIS — Z3A16 16 weeks gestation of pregnancy: Secondary | ICD-10-CM

## 2020-03-09 NOTE — Progress Notes (Signed)
   LOW-RISK PREGNANCY OFFICE VISIT Patient name: Leah Price MRN [id number]  Date of birth: [birth year] Chief Complaint:   Routine Prenatal Visit  History of Present Illness:   Leah Price is a 19 y.o. G20P1001 female at [redacted]w[redacted]d with an Estimated Date of Delivery: 08/21/20 being seen today for ongoing management of a low-risk pregnancy.  Today she reports no complaints. She reports that she has not picked up the Rx for Metrogel, but plans to today. She states the vaginal irritation is better. Contractions: Not present. Vag. Bleeding: None.  Movement: Present - "mostly at night". denies leaking of fluid. Review of Systems:   Pertinent items are noted in HPI Denies abnormal vaginal discharge w/ itching/odor/irritation, headaches, visual changes, shortness of breath, chest pain, abdominal pain, severe nausea/vomiting, or problems with urination or bowel movements unless otherwise stated above. Pertinent History Reviewed:  Reviewed past medical,surgical, social, obstetrical and family history.  Reviewed problem list, medications and allergies. Physical Assessment:   Vitals:   03/09/20 1050  BP: 100/68  Pulse: 72  Weight: 115 lb (52.2 kg)  Body mass index is 19.14 kg/m.        Physical Examination:   General appearance: Well appearing, and in no distress  Mental status: Alert, oriented to person, place, and time  Skin: Warm & dry  Cardiovascular: Normal heart rate noted  Respiratory: Normal respiratory effort, no distress  Abdomen: Soft, gravid, nontender  Pelvic: Cervical exam deferred         Extremities: Edema: None  Fetal Status: Fetal Heart Rate (bpm): 150   Movement: Present   S=D  No results found for this or any previous visit (from the past 24 hour(s)).  Assessment & Plan:  1) Low-risk pregnancy G2P1001 at [redacted]w[redacted]d with an Estimated Date of Delivery: 08/21/20   2) Supervision of other normal pregnancy, antepartum - Reviewed normal growth at this gestation - Anatomy U/S  scheduled for 03/28/20 - My Chart visit in 4 wks   Meds: No orders of the defined types were placed in this encounter.  Labs/procedures today: none  Plan:  Continue routine obstetrical care   Reviewed: Preterm labor symptoms and general obstetric precautions including but not limited to vaginal bleeding, contractions, leaking of fluid and fetal movement were reviewed in detail with the patient.  All questions were answered. Check bp weekly, let us know if >140/90.   Follow-up: Return in about 4 weeks (around 04/06/2020) for Return OB - My Chart video.  No orders of the defined types were placed in this encounter.  Raelyn Mora MSN, CNM 03/09/2020 11:01 AM

## 2020-03-09 NOTE — Progress Notes (Signed)
ROB  CC: None nausea is getting better.

## 2020-03-23 NOTE — Progress Notes (Signed)
Patient ID: Leah Price, female   DOB: 2000-10-25, 18 y.o.   MRN: [id number] Patient was assessed and managed by nursing staff during this encounter. I have reviewed the chart and agree with the documentation and plan. I have also made any necessary editorial changes.  Scheryl Darter, MD 03/23/2020 1:38 PM

## 2020-03-28 ENCOUNTER — Other Ambulatory Visit: Payer: Self-pay | Admitting: *Deleted

## 2020-03-28 ENCOUNTER — Ambulatory Visit: Payer: Medicaid Other | Attending: Obstetrics and Gynecology

## 2020-03-28 ENCOUNTER — Other Ambulatory Visit: Payer: Self-pay | Admitting: Obstetrics & Gynecology

## 2020-03-28 ENCOUNTER — Other Ambulatory Visit: Payer: Self-pay

## 2020-03-28 DIAGNOSIS — Z3480 Encounter for supervision of other normal pregnancy, unspecified trimester: Secondary | ICD-10-CM | POA: Insufficient documentation

## 2020-03-28 DIAGNOSIS — Z3A19 19 weeks gestation of pregnancy: Secondary | ICD-10-CM

## 2020-03-28 DIAGNOSIS — Z363 Encounter for antenatal screening for malformations: Secondary | ICD-10-CM

## 2020-03-28 DIAGNOSIS — O09292 Supervision of pregnancy with other poor reproductive or obstetric history, second trimester: Secondary | ICD-10-CM

## 2020-03-28 DIAGNOSIS — O09293 Supervision of pregnancy with other poor reproductive or obstetric history, third trimester: Secondary | ICD-10-CM

## 2020-03-28 DIAGNOSIS — Z148 Genetic carrier of other disease: Secondary | ICD-10-CM | POA: Diagnosis not present

## 2020-04-06 ENCOUNTER — Encounter: Payer: Self-pay | Admitting: Medical

## 2020-04-06 ENCOUNTER — Telehealth (INDEPENDENT_AMBULATORY_CARE_PROVIDER_SITE_OTHER): Payer: Medicaid Other | Admitting: Medical

## 2020-04-06 VITALS — BP 116/67 | Wt 118.4 lb

## 2020-04-06 DIAGNOSIS — D563 Thalassemia minor: Secondary | ICD-10-CM

## 2020-04-06 DIAGNOSIS — Z3480 Encounter for supervision of other normal pregnancy, unspecified trimester: Secondary | ICD-10-CM

## 2020-04-06 DIAGNOSIS — Z148 Genetic carrier of other disease: Secondary | ICD-10-CM

## 2020-04-06 DIAGNOSIS — Z8759 Personal history of other complications of pregnancy, childbirth and the puerperium: Secondary | ICD-10-CM

## 2020-04-06 DIAGNOSIS — Z3A20 20 weeks gestation of pregnancy: Secondary | ICD-10-CM

## 2020-04-06 DIAGNOSIS — Z3482 Encounter for supervision of other normal pregnancy, second trimester: Secondary | ICD-10-CM

## 2020-04-06 NOTE — Progress Notes (Signed)
I connected with Leah Price 04/06/20 at 10:00 AM EDT by: MyChart video and verified that I am speaking with the correct person using two identifiers.  Patient is located at home and provider is located at [facility].     The purpose of this virtual visit is to provide medical care while limiting exposure to the novel coronavirus. I discussed the limitations, risks, security and privacy concerns of performing an evaluation and management service by MyChart video and the availability of in person appointments. I also discussed with the patient that there may be a patient responsible charge related to this service. By engaging in this virtual visit, you consent to the provision of healthcare.  Additionally, you authorize for your insurance to be billed for the services provided during this visit.  The patient expressed understanding and agreed to proceed.  The following staff members participated in the virtual visit:  Chaney Malling, CMA    PRENATAL VISIT NOTE  Subjective:  Leah Price is a 19 y.o. G2P1001 at [redacted]w[redacted]d  for phone visit for ongoing prenatal care.  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  Patient reports no complaints.  Contractions: Not present. Vag. Bleeding: None.  Movement: Present. Denies leaking of fluid.   The following portions of the patient's history were reviewed and updated as appropriate: allergies, current medications, past family history, past medical history, past social history, past surgical history and problem list.   Objective:   Vitals:   04/06/20 0943  BP: 116/67  Weight: 118 lb 6.4 oz (53.7 kg)   Self-Obtained  Fetal Status:     Movement: Present     Assessment and Plan:  Pregnancy: G2P1001 at [redacted]w[redacted]d 1. Supervision of  other normal pregnancy, antepartum - Doing well - Declines AFP  2. Alpha thalassemia silent carrier - Discussed results with patient  - Declines Genetic counseling or partner testing at this time   3. 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 History of prior pregnancy with IUGR newborn - Follow-up US 8/23 - MFM also recommends growth [place] at 32 weeks   5. [redacted] weeks gestation of pregnancy  Preterm labor symptoms and general obstetric precautions including but not limited to vaginal bleeding, contractions, leaking of fluid and fetal movement were reviewed in detail with the patient.  Return in about 4 weeks (around 05/04/2020) for LOB, Virtual.  Future Appointments  Date Time Provider Department Center  04/25/2020  2:30 PM [facility] NURSE [facility]  04/25/2020  2:45 PM [facility]     Time spent on virtual visit: 10 minutes  Vonzella Nipple, PA-C

## 2020-04-06 NOTE — Patient Instructions (Signed)

## 2020-04-06 NOTE — Progress Notes (Signed)
Virtual Visit via Telephone Note  I connected with Leah Price on 04/06/20 at 10:00 AM EDT by telephone and verified that I am speaking with the correct person using two identifiers.  No complaints per pt

## 2020-04-25 ENCOUNTER — Encounter: Payer: Self-pay | Admitting: *Deleted

## 2020-04-25 ENCOUNTER — Other Ambulatory Visit: Payer: Self-pay

## 2020-04-25 ENCOUNTER — Ambulatory Visit: Payer: Medicaid Other | Admitting: *Deleted

## 2020-04-25 ENCOUNTER — Ambulatory Visit: Payer: Medicaid Other | Attending: Obstetrics and Gynecology

## 2020-04-25 VITALS — BP 105/70 | HR 96

## 2020-04-25 DIAGNOSIS — Z148 Genetic carrier of other disease: Secondary | ICD-10-CM

## 2020-04-25 DIAGNOSIS — Z362 Encounter for other antenatal screening follow-up: Secondary | ICD-10-CM | POA: Diagnosis not present

## 2020-04-25 DIAGNOSIS — Z3A23 23 weeks gestation of pregnancy: Secondary | ICD-10-CM | POA: Diagnosis not present

## 2020-04-25 DIAGNOSIS — O09299 Supervision of pregnancy with other poor reproductive or obstetric history, unspecified trimester: Secondary | ICD-10-CM | POA: Insufficient documentation

## 2020-04-25 DIAGNOSIS — O09292 Supervision of pregnancy with other poor reproductive or obstetric history, second trimester: Secondary | ICD-10-CM | POA: Insufficient documentation

## 2020-04-26 ENCOUNTER — Other Ambulatory Visit: Payer: Self-pay | Admitting: *Deleted

## 2020-04-26 DIAGNOSIS — Z8759 Personal history of other complications of pregnancy, childbirth and the puerperium: Secondary | ICD-10-CM

## 2020-05-04 ENCOUNTER — Encounter: Payer: Self-pay | Admitting: Women's Health

## 2020-05-04 ENCOUNTER — Telehealth (INDEPENDENT_AMBULATORY_CARE_PROVIDER_SITE_OTHER): Payer: Medicaid Other | Admitting: Women's Health

## 2020-05-04 DIAGNOSIS — Z283 Underimmunization status: Secondary | ICD-10-CM

## 2020-05-04 DIAGNOSIS — Z8759 Personal history of other complications of pregnancy, childbirth and the puerperium: Secondary | ICD-10-CM

## 2020-05-04 DIAGNOSIS — Z148 Genetic carrier of other disease: Secondary | ICD-10-CM

## 2020-05-04 DIAGNOSIS — O09899 Supervision of other high risk pregnancies, unspecified trimester: Secondary | ICD-10-CM | POA: Insufficient documentation

## 2020-05-04 DIAGNOSIS — Z3A24 24 weeks gestation of pregnancy: Secondary | ICD-10-CM

## 2020-05-04 DIAGNOSIS — Z3480 Encounter for supervision of other normal pregnancy, unspecified trimester: Secondary | ICD-10-CM

## 2020-05-04 DIAGNOSIS — D563 Thalassemia minor: Secondary | ICD-10-CM

## 2020-05-04 DIAGNOSIS — O99012 Anemia complicating pregnancy, second trimester: Secondary | ICD-10-CM

## 2020-05-04 DIAGNOSIS — O09292 Supervision of pregnancy with other poor reproductive or obstetric history, second trimester: Secondary | ICD-10-CM

## 2020-05-04 NOTE — Patient Instructions (Addendum)
Maternity Assessment Unit (MAU)  The Maternity Assessment Unit (MAU) is located at the [facility] at [facility]. The address is: [address], [place], [place], [place] [address]. Please see map below for additional directions.    The Maternity Assessment Unit is designed to help you during your pregnancy, and for up to 6 weeks after delivery, with any pregnancy- or postpartum-related emergencies, if you think you are in labor, or if your water has broken. For example, if you experience nausea and vomiting, vaginal bleeding, severe abdominal or pelvic pain, elevated blood pressure or other problems related to your pregnancy or postpartum time, please come to the Maternity Assessment Unit for assistance.\       Preterm Labor and Birth Information  The normal length of a pregnancy is 39-41 weeks. Preterm labor is when labor starts before 37 completed weeks of pregnancy. What are the risk factors for preterm labor? Preterm labor is more likely to occur in women who:  Have certain infections during pregnancy such as a bladder infection, sexually transmitted infection, or infection inside the uterus (chorioamnionitis).  Have a shorter-than-normal cervix.  Have gone into preterm labor before.  Have had surgery on their cervix.  Are younger than age 32 or older than age 70.  Are African American.  Are pregnant with twins or multiple babies (multiple gestation).  Take street drugs or smoke while pregnant.  Do not gain enough weight while pregnant.  Became pregnant shortly after having been pregnant. What are the symptoms of preterm labor? Symptoms of preterm labor include:  Cramps similar to those that can happen during a menstrual period. The cramps may happen with diarrhea.  Pain in the abdomen or lower back.  Regular uterine contractions that may feel like tightening of the abdomen.  A feeling of increased pressure in the  pelvis.  Increased watery or bloody mucus discharge from the vagina.  Water breaking (ruptured amniotic sac). Why is it important to recognize signs of preterm labor? It is important to recognize signs of preterm labor because babies who are born prematurely may not be fully developed. This can put them at an increased risk for:  Long-term (chronic) heart and lung problems.  Difficulty immediately after birth with regulating body systems, including blood sugar, body temperature, heart rate, and breathing rate.  Bleeding in the brain.  Cerebral palsy.  Learning difficulties.  Death. These risks are highest for babies who are born before 34 weeks of pregnancy. How is preterm labor treated? Treatment depends on the length of your pregnancy, your condition, and the health of your baby. It may involve:  Having a stitch (suture) placed in your cervix to prevent your cervix from opening too early (cerclage).  Taking or being given medicines, such as: ? Hormone medicines. These may be given early in pregnancy to help support the pregnancy. ? Medicine to stop contractions. ? Medicines to help mature the baby's lungs. These may be prescribed if the risk of delivery is high. ? Medicines to prevent your baby from developing cerebral palsy. If the labor happens before 34 weeks of pregnancy, you may need to stay in the hospital. What should I do if I think I am in preterm labor? If you think that you are going into preterm labor, call your health care provider right away. How can I prevent preterm labor in future pregnancies? To increase your chance of having a full-term pregnancy:  Do not use any tobacco products, such as cigarettes,  chewing tobacco, and e-cigarettes. If you need help quitting, ask your health care provider.  Do not use street drugs or medicines that have not been prescribed to you during your pregnancy.  Talk with your health care provider before taking any herbal  supplements, even if you have been taking them regularly.  Make sure you gain a healthy amount of weight during your pregnancy.  Watch for infection. If you think that you might have an infection, get it checked right away.  Make sure to tell your health care provider if you have gone into preterm labor before. This information is not intended to replace advice given to you by your health care provider. Make sure you discuss any questions you have with your health care provider. Document Revised: 12/12/2018 Document Reviewed: 01/11/2016 Elsevier Patient Education  2020 [employer].       Childbirth Education Options: [facility] Department Classes:  Childbirth education classes can help you get ready for a positive parenting experience. You can also meet other expectant parents and get free stuff for your baby. Each class runs for five weeks on the same night and costs $45 for the mother-to-be and her support person. Medicaid covers the cost if you are eligible. Call [phone] to register. [facility] Childbirth Education:  [phone] or [phone] or [email or web address]  Baby & Me Class: Discuss newborn & infant parenting and family adjustment issues with other new mothers in a relaxed environment. Each week brings a new speaker or baby-centered activity. We encourage new mothers to join [place] every Thursday at 11:00am. Babies birth until crawling. No registration or fee. Daddy [employer]: This course offers Dads-to-be the tools and knowledge needed to feel confident on their journey to becoming new fathers. Experienced dads, who have been trained as coaches, teach dads-to-be how to hold, comfort, diaper, swaddle and play with their infant while being able to support the new mom as well. A class for men taught by men. $25/dad Big Brother/Big Sister: Let your children share in the joy of a new brother or sister in this special class designed just for them. Class  includes discussion about how families care for babies: swaddling, holding, diapering, safety as well as how they can be helpful in their new role. This class is designed for children ages 2 to 51, but any age is welcome. Please register each child individually. $5/child  Mom Talk: This mom-led group offers support and connection to mothers as they journey through the adjustments and struggles of that sometimes overwhelming first year after the birth of a child. Tuesdays at 10:00am and Thursdays at 6:00pm. Babies welcome. No registration or fee. Breastfeeding Support Group: This group is a mother-to-mother support circle where moms have the opportunity to share their breastfeeding experiences. A Lactation Consultant is present for questions and concerns. Meets each Tuesday at 11:00am. No fee or registration. Breastfeeding Your Baby: Learn what to expect in the first days of breastfeeding your newborn.  This class will help you feel more confident with the skills needed to begin your breastfeeding experience. Many new mothers are concerned about breastfeeding after leaving the hospital. This class will also address the most common fears and challenges about breastfeeding during the first few weeks, months and beyond. (call for fee) Comfort Techniques and Tour: This 2 hour interactive class will provide you the opportunity to learn & practice hands-on techniques that can help relieve some of the discomfort of labor and encourage your baby to rotate toward the best position  for birth. You and your partner will be able to try a variety of labor positions with birth balls and rebozos as well as practice breathing, relaxation, and visualization techniques. A tour of the [facility] is included with this class. $20 per registrant and support person Childbirth Class- Weekend Option: This class is a Weekend version of our Birth & Baby series. It is designed for parents who have a difficult time  fitting several weeks of classes into their schedule. It covers the care of your newborn and the basics of labor and childbirth. It also includes a Maternity Care Center Tour of [facility] and lunch. The class is held two consecutive days: beginning on Friday evening from 6:30 - 8:30 p.m. and the next day, Saturday from 9 a.m. - 4 p.m. (call for fee) Linden Dolin Class: Interested in a waterbirth?  This informational class will help you discover whether waterbirth is the right fit for you. Education about waterbirth itself, supplies you would need and how to assemble your support team is what you can expect from this class. Some obstetrical practices require this class in order to pursue a waterbirth. (Not all obstetrical practices offer waterbirth-check with your healthcare provider.) Register only the expectant mom, but you are encouraged to bring your partner to class! Required if planning waterbirth, no fee. Infant/Child CPR: Parents, grandparents, babysitters, and friends learn Cardio-Pulmonary Resuscitation skills for infants and children. You will also learn how to treat both conscious and unconscious choking in infants and children. This Family & Friends program does not offer certification. Register each participant individually to ensure that enough mannequins are available. (Call for fee) Grandparent Love: Expecting a grandbaby? This class is for you! Learn about the latest infant care and safety recommendations and ways to support your own child as he or she transitions into the parenting role. Taught by Registered Nurses who are childbirth instructors, but most importantly...they are grandmothers too! $10/person. Childbirth Class- Natural Childbirth: This series of 5 weekly classes is for expectant parents who want to learn and practice natural methods of coping with the process of labor and childbirth. Relaxation, breathing, massage, visualization, role of the partner, and helpful positioning  are highlighted. Participants learn how to be confident in their body's ability to give birth. This class will empower and help parents make informed decisions about their own care. Includes discussion that will help new parents transition into the immediate postpartum period. Maternity Care Center Tour of [facility] is included. We suggest taking this class between 25-32 weeks, but it's only a recommendation. $75 per registrant and one support person or $30 Medicaid. Childbirth Class- 3 week Series: This option of 3 weekly classes helps you and your labor partner prepare for childbirth. Newborn care, labor & birth, cesarean birth, pain management, and comfort techniques are discussed and a Maternity Care Center Tour of [facility] is included. The class meets at the same time, on the same day of the week for 3 consecutive weeks beginning with the starting date you choose. $60 for registrant and one support person.  Marvelous Multiples: Expecting twins, triplets, or more? This class covers the differences in labor, birth, parenting, and breastfeeding issues that face multiples' parents. NICU tour is included. Led by a Certified Childbirth Educator who is the mother of twins. No fee. Caring for Baby: This class is for expectant and adoptive parents who want to learn and practice the most up-to-date newborn care for their babies. Focus is on birth through  the first six weeks of life. Topics include feeding, bathing, diapering, crying, umbilical cord care, circumcision care and safe sleep. Parents learn to recognize symptoms of illness and when to call the pediatrician. Register only the mom-to-be and your partner or support person can plan to come with you! $10 per registrant and support person Childbirth Class- online option: This online class offers you the freedom to complete a Birth and Baby series in the comfort of your own home. The flexibility of this option allows you to review sections at your  own pace, at times convenient to you and your support people. It includes additional video information, animations, quizzes, and extended activities. Get organized with helpful eClass tools, checklists, and trackers. Once you register online for the class, you will receive an email within a few days to accept the invitation and begin the class when the time is right for you. The content will be available to you for 60 days. $60 for 60 days of online access for you and your support people.         Glucose Tolerance Test During Pregnancy Why am I having this test? The glucose tolerance test (GTT) is done to check how your body processes sugar (glucose). This is one of several tests used to diagnose diabetes that develops during pregnancy (gestational diabetes mellitus). Gestational diabetes is a temporary form of diabetes that some women develop during pregnancy. It usually occurs during the second trimester of pregnancy and goes away after delivery. Testing (screening) for gestational diabetes usually occurs between 24 and 28 weeks of pregnancy. You may have the GTT test after having a 1-hour glucose screening test if the results from that test indicate that you may have gestational diabetes. You may also have this test if:  You have a history of gestational diabetes.  You have a history of giving birth to very large babies or have experienced repeated fetal loss (stillbirth).  You have signs and symptoms of diabetes, such as: ? Changes in your vision. ? Tingling or numbness in your hands or feet. ? Changes in hunger, thirst, and urination that are not otherwise explained by your pregnancy. What is being tested? This test measures the amount of glucose in your blood at different times during a period of 3 hours. This indicates how well your body is able to process glucose. What kind of sample is taken?  Blood samples are required for this test. They are usually collected by inserting a  needle into a blood vessel. How do I prepare for this test?  For 3 days before your test, eat normally. Have plenty of carbohydrate-rich foods.  Follow instructions from your health care provider about: ? Eating or drinking restrictions on the day of the test. You may be asked to not eat or drink anything other than water (fast) starting 8-10 hours before the test. ? Changing or stopping your regular medicines. Some medicines may interfere with this test. Tell a health care provider about:  All medicines you are taking, including vitamins, herbs, eye drops, creams, and over-the-counter medicines.  Any blood disorders you have.  Any surgeries you have had.  Any medical conditions you have. What happens during the test? First, your blood glucose will be measured. This is referred to as your fasting blood glucose, since you fasted before the test. Then, you will drink a glucose solution that contains a certain amount of glucose. Your blood glucose will be measured again 1, 2, and 3 hours after drinking the  solution. This test takes about 3 hours to complete. You will need to stay at the testing location during this time. During the testing period:  Do not eat or drink anything other than the glucose solution.  Do not exercise.  Do not use any products that contain nicotine or tobacco, such as cigarettes and e-cigarettes. If you need help stopping, ask your health care provider. The testing procedure may vary among health care providers and hospitals. How are the results reported? Your results will be reported as milligrams of glucose per deciliter of blood (mg/dL) or millimoles per liter (mmol/L). Your health care provider will compare your results to normal ranges that were established after testing a large group of people (reference ranges). Reference ranges may vary among labs and hospitals. For this test, common reference ranges are:  Fasting: less than 95-105 mg/dL (1.6-1.0  mmol/L).  1 hour after drinking glucose: less than 180-190 mg/dL (96.0-45.4 mmol/L).  2 hours after drinking glucose: less than 155-165 mg/dL (0.9-8.1 mmol/L).  3 hours after drinking glucose: 140-145 mg/dL (1.9-1.4 mmol/L). What do the results mean? Results within reference ranges are considered normal, meaning that your glucose levels are well-controlled. If two or more of your blood glucose levels are high, you may be diagnosed with gestational diabetes. If only one level is high, your health care provider may suggest repeat testing or other tests to confirm a diagnosis. Talk with your health care provider about what your results mean. Questions to ask your health care provider Ask your health care provider, or the department that is doing the test:  When will my results be ready?  How will I get my results?  What are my treatment options?  What other tests do I need?  What are my next steps? Summary  The glucose tolerance test (GTT) is one of several tests used to diagnose diabetes that develops during pregnancy (gestational diabetes mellitus). Gestational diabetes is a temporary form of diabetes that some women develop during pregnancy.  You may have the GTT test after having a 1-hour glucose screening test if the results from that test indicate that you may have gestational diabetes. You may also have this test if you have any symptoms or risk factors for gestational diabetes.  Talk with your health care provider about what your results mean. This information is not intended to replace advice given to you by your health care provider. Make sure you discuss any questions you have with your health care provider. Document Revised: 12/11/2018 Document Reviewed: 04/01/2017 Elsevier Patient Education  2020 [employer].       https://www.cdc.gov/vaccines/hcp/vis/vis-statements/tdap.pdf">  Tdap (Tetanus, Diphtheria, Pertussis) Vaccine: What You Need to Know 1. Why get  vaccinated? Tdap vaccine can prevent tetanus, diphtheria, and pertussis. Diphtheria and pertussis spread from person to person. Tetanus enters the body through cuts or wounds.  TETANUS (T) causes painful stiffening of the muscles. Tetanus can lead to serious health problems, including being unable to open the mouth, having trouble swallowing and breathing, or death.  DIPHTHERIA (D) can lead to difficulty breathing, heart failure, paralysis, or death.  PERTUSSIS (aP), also known as "whooping cough," can cause uncontrollable, violent coughing which makes it hard to breathe, eat, or drink. Pertussis can be extremely serious in babies and young children, causing pneumonia, convulsions, brain damage, or death. In teens and adults, it can cause weight loss, loss of bladder control, passing out, and rib fractures from severe coughing. 2. Tdap vaccine Tdap is only for children 7 years  and older, adolescents, and adults.  Adolescents should receive a single dose of Tdap, preferably at age 21 or 12 years. Pregnant women should get a dose of Tdap during every pregnancy, to protect the newborn from pertussis. Infants are most at risk for severe, life-threatening complications from pertussis. Adults who have never received Tdap should get a dose of Tdap. Also, adults should receive a booster dose every 10 years, or earlier in the case of a severe and dirty wound or burn. Booster doses can be either Tdap or Td (a different vaccine that protects against tetanus and diphtheria but not pertussis). Tdap may be given at the same time as other vaccines. 3. Talk with your health care provider Tell your vaccine provider if the person getting the vaccine:  Has had an allergic reaction after a previous dose of any vaccine that protects against tetanus, diphtheria, or pertussis, or has any severe, life-threatening allergies.  Has had a coma, decreased level of consciousness, or prolonged seizures within 7 days after a  previous dose of any pertussis vaccine (DTP, DTaP, or Tdap).  Has seizures or another nervous system problem.  Has ever had Guillain-Barr Syndrome (also called GBS).  Has had severe pain or swelling after a previous dose of any vaccine that protects against tetanus or diphtheria. In some cases, your health care provider may decide to postpone Tdap vaccination to a future visit.  People with minor illnesses, such as a cold, may be vaccinated. People who are moderately or severely ill should usually wait until they recover before getting Tdap vaccine.  Your health care provider can give you more information. 4. Risks of a vaccine reaction  Pain, redness, or swelling where the shot was given, mild fever, headache, feeling tired, and nausea, vomiting, diarrhea, or stomachache sometimes happen after Tdap vaccine. People sometimes faint after medical procedures, including vaccination. Tell your provider if you feel dizzy or have vision changes or ringing in the ears.  As with any medicine, there is a very remote chance of a vaccine causing a severe allergic reaction, other serious injury, or death. 5. What if there is a serious problem? An allergic reaction could occur after the vaccinated person leaves the clinic. If you see signs of a severe allergic reaction (hives, swelling of the face and throat, difficulty breathing, a fast heartbeat, dizziness, or weakness), call 9-1-1 and get the person to the nearest hospital. For other signs that concern you, call your health care provider.  Adverse reactions should be reported to the Vaccine Adverse Event Reporting System (VAERS). Your health care provider will usually file this report, or you can do it yourself. Visit the VAERS website at www.vaers.LAgents.nohhs.gov or call 254 361 64111-480-012-3097. VAERS is only for reporting reactions, and VAERS staff do not give medical advice. 6. The National Vaccine Injury Compensation Program The [employer] Vaccine Injury Compensation  Program (VICP) is a federal program that was created to compensate people who may have been injured by certain vaccines. Visit the VICP website at SpiritualWord.atwww.hrsa.gov/vaccinecompensation or call 801-320-83921-830-685-9535 to learn about the program and about filing a claim. There is a time limit to file a claim for compensation. 7. How can I learn more?  Ask your health care provider.  Call your local or state health department.  Contact the Centers for Disease Control and Prevention (CDC): ? Call (862)295-10321-757 651 9872 (1-800-CDC-INFO) or ? Visit CDC's website at PicCapture.uywww.cdc.gov/vaccines Vaccine Information Statement Tdap (Tetanus, Diphtheria, Pertussis) Vaccine (12/03/2018) This information is not intended to replace advice given to you  by your health care provider. Make sure you discuss any questions you have with your health care provider. Document Revised: 12/12/2018 Document Reviewed: 12/15/2018 Elsevier Patient Education  2020 [employer].

## 2020-05-04 NOTE — Progress Notes (Signed)
I connected with Leah Price 05/04/20 at 10:15 AM EDT by: MyChart video and verified that I am speaking with the correct person using two identifiers.  Patient is located at work and provider is located at [facility].     The purpose of this virtual visit is to provide medical care while limiting exposure to the novel coronavirus. I discussed the limitations, risks, security and privacy concerns of performing an evaluation and management service by MyChart video and the availability of in person appointments. I also discussed with the patient that there may be a patient responsible charge related to this service. By engaging in this virtual visit, you consent to the provision of healthcare.  Additionally, you authorize for your insurance to be billed for the services provided during this visit.  The patient expressed understanding and agreed to proceed.  The following staff members participated in the virtual visit:  Donia Ast, NP, Job Founds, SNP    PRENATAL VISIT NOTE  Subjective:  Leah Price is a 19 y.o. G2P1001 at [redacted]w[redacted]d  for phone visit for ongoing prenatal care.  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  Patient reports no complaints.  Contractions: Not present. Vag. Bleeding: None.  Movement: Present. Denies leaking of fluid.   The following portions of the patient's history were reviewed and updated as appropriate: allergies, current medications, past family history, past medical history, past social history, past surgical history and problem list.   Objective:  There were no vitals filed for this visit. Pt at work and unable to take BP, will take when she gets home and send results via MyChart.  Fetal  Status:     Movement: Present     Assessment and Plan:  Pregnancy: G2P1001 at [redacted]w[redacted]d  1. Supervision of other normal pregnancy, antepartum -CBE information given -GTT/labs next visit  2. History of prior pregnancy with IUGR newborn -growth [place] at 32 weeks  3. Alpha thalassemia silent carrier -pt offered and declines genetic counseling - had with first pregnancy  4. 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 I discussed the assessment and treatment plan with the patient. The patient was provided an opportunity to ask questions and all were answered. The patient agreed with the plan and demonstrated an understanding of the instructions. The patient was advised to call back or seek an in-person office evaluation/go to MAU at [facility] for any urgent or concerning symptoms.  Return in about 3 weeks (around 05/25/2020) for in-person LOB/APP OK/GTT/labs.  Future Appointments  Date Time Provider Department Center  06/27/2020  9:45 AM [facility] NURSE [facility]  06/27/2020 10:00 AM [facility] US1 [facility]     Time spent on virtual visit: 10 minutes  Marylen Ponto, NP

## 2020-05-04 NOTE — Progress Notes (Signed)
Pt. Is unable to take BP at this time.

## 2020-05-25 ENCOUNTER — Other Ambulatory Visit: Payer: Medicaid Other

## 2020-05-25 ENCOUNTER — Ambulatory Visit (INDEPENDENT_AMBULATORY_CARE_PROVIDER_SITE_OTHER): Payer: Medicaid Other | Admitting: Women's Health

## 2020-05-25 ENCOUNTER — Other Ambulatory Visit: Payer: Self-pay

## 2020-05-25 VITALS — BP 113/80 | HR 75 | Wt 129.5 lb

## 2020-05-25 DIAGNOSIS — Z23 Encounter for immunization: Secondary | ICD-10-CM | POA: Diagnosis not present

## 2020-05-25 DIAGNOSIS — O99891 Other specified diseases and conditions complicating pregnancy: Secondary | ICD-10-CM

## 2020-05-25 DIAGNOSIS — Z148 Genetic carrier of other disease: Secondary | ICD-10-CM

## 2020-05-25 DIAGNOSIS — D563 Thalassemia minor: Secondary | ICD-10-CM

## 2020-05-25 DIAGNOSIS — Z8759 Personal history of other complications of pregnancy, childbirth and the puerperium: Secondary | ICD-10-CM

## 2020-05-25 DIAGNOSIS — Z283 Underimmunization status: Secondary | ICD-10-CM

## 2020-05-25 DIAGNOSIS — O09899 Supervision of other high risk pregnancies, unspecified trimester: Secondary | ICD-10-CM

## 2020-05-25 DIAGNOSIS — Z3480 Encounter for supervision of other normal pregnancy, unspecified trimester: Secondary | ICD-10-CM | POA: Diagnosis not present

## 2020-05-25 DIAGNOSIS — Z3A27 27 weeks gestation of pregnancy: Secondary | ICD-10-CM

## 2020-05-25 NOTE — Progress Notes (Signed)
Subjective:  Shacarra Charney is a 19 y.o. G2P1001 at [redacted]w[redacted]d being seen today for ongoing prenatal care.  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  Patient reports no complaints.  Contractions: Not present. Vag. Bleeding: None.  Movement: Present. Denies leaking of fluid.   The following portions of the patient's history were reviewed and updated as appropriate: allergies, current medications, past family history, past medical history, past social history, past surgical history and problem list. Problem list updated.  Objective:   Vitals:   05/25/20 0838  BP: 113/80  Pulse: 75  Weight: 129 lb 8 oz (58.7 kg)    Fetal Status: Fetal Heart Rate (bpm): 142 Fundal Height: 27 cm Movement: Present     General:  Alert, oriented and cooperative. Patient is in no acute distress.  Skin: Skin is warm and dry. No rash noted.   Cardiovascular: Normal heart rate noted  Respiratory: Normal respiratory effort, no problems with respiration noted  Abdomen: Soft, gravid, appropriate for gestational age. Pain/Pressure: Absent     Pelvic: Vag. Bleeding: None     Cervical exam deferred        Extremities: Normal range of motion.  Edema: None  Mental Status: Normal mood and affect. Normal behavior. Normal judgment and thought content.   Urinalysis:      Assessment and Plan:  Pregnancy: G2P1001 at [redacted]w[redacted]d  1. Supervision of other normal pregnancy, antepartum - prenatal class information given - Glucose Tolerance, 2 Hours w/1 Hour - CBC - HIV antibody (with reflex) - RPR - Tdap vaccine greater than or equal to 7yo IM  2. Rubella non-immune status, antepartum - vaccination ppartum  3. 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 Alpha thalassemia  silent carrier - declined genetic counseling  5. History of prior pregnancy with IUGR newborn - growth [place] scheduled 06/27/2020  Preterm labor symptoms and general obstetric precautions including but not limited to vaginal bleeding, contractions, leaking of fluid and fetal movement were reviewed in detail with the patient. I discussed the assessment and treatment plan with the patient. The patient was provided an opportunity to ask questions and all were answered. The patient agreed with the plan and demonstrated an understanding of the instructions. The patient was advised to call back or seek an in-person office evaluation/go to MAU at [facility] for any urgent or concerning symptoms. Please refer to After Visit Summary for other counseling recommendations.  Return in about 2 weeks (around 06/08/2020) for virtual ROB/APP OK.   Clytee Heinrich, Odie Sera, NP

## 2020-05-25 NOTE — Patient Instructions (Addendum)
Maternity Assessment Unit (MAU)  The Maternity Assessment Unit (MAU) is located at the [facility] at [facility]. The address is: [address] [place], [place], [place] [address]. Please see map below for additional directions.    The Maternity Assessment Unit is designed to help you during your pregnancy, and for up to 6 weeks after delivery, with any pregnancy- or postpartum-related emergencies, if you think you are in labor, or if your water has broken. For example, if you experience nausea and vomiting, vaginal bleeding, severe abdominal or pelvic pain, elevated blood pressure or other problems related to your pregnancy or postpartum time, please come to the Maternity Assessment Unit for assistance.       Childbirth Education Options: [facility] Department Classes:  Childbirth education classes can help you get ready for a positive parenting experience. You can also meet other expectant parents and get free stuff for your baby. Each class runs for five weeks on the same night and costs $45 for the mother-to-be and her support person. Medicaid covers the cost if you are eligible. Call [phone] to register. [facility] Childbirth Education:  [phone] or [phone] or [email or web address]  Baby & Me Class: Discuss newborn & infant parenting and family adjustment issues with other new mothers in a relaxed environment. Each week brings a new speaker or baby-centered activity. We encourage new mothers to join [place] every Thursday at 11:00am. Babies birth until crawling. No registration or fee. Daddy [employer]: This course offers Dads-to-be the tools and knowledge needed to feel confident on their journey to becoming new fathers. Experienced dads, who have been trained as coaches, teach dads-to-be how to hold, comfort, diaper, swaddle and play with their infant while being able to support the new mom as well. A class for men taught by  men. $25/dad Big Brother/Big Sister: Let your children share in the joy of a new brother or sister in this special class designed just for them. Class includes discussion about how families care for babies: swaddling, holding, diapering, safety as well as how they can be helpful in their new role. This class is designed for children ages 2 to 39, but any age is welcome. Please register each child individually. $5/child  Mom Talk: This mom-led group offers support and connection to mothers as they journey through the adjustments and struggles of that sometimes overwhelming first year after the birth of a child. Tuesdays at 10:00am and Thursdays at 6:00pm. Babies welcome. No registration or fee. Breastfeeding Support Group: This group is a mother-to-mother support circle where moms have the opportunity to share their breastfeeding experiences. A Lactation Consultant is present for questions and concerns. Meets each Tuesday at 11:00am. No fee or registration. Breastfeeding Your Baby: Learn what to expect in the first days of breastfeeding your newborn.  This class will help you feel more confident with the skills needed to begin your breastfeeding experience. Many new mothers are concerned about breastfeeding after leaving the hospital. This class will also address the most common fears and challenges about breastfeeding during the first few weeks, months and beyond. (call for fee) Comfort Techniques and Tour: This 2 hour interactive class will provide you the opportunity to learn & practice hands-on techniques that can help relieve some of the discomfort of labor and encourage your baby to rotate toward the best position for birth. You and your partner will be able to try a variety of labor positions with birth balls and rebozos as well  as practice breathing, relaxation, and visualization techniques. A tour of the [facility] is included with this class. $20 per registrant and support  person Childbirth Class- Weekend Option: This class is a Weekend version of our Birth & Baby series. It is designed for parents who have a difficult time fitting several weeks of classes into their schedule. It covers the care of your newborn and the basics of labor and childbirth. It also includes a Maternity Care Center Tour of [facility] and lunch. The class is held two consecutive days: beginning on Friday evening from 6:30 - 8:30 p.m. and the next day, Saturday from 9 a.m. - 4 p.m. (call for fee) Linden Dolin Class: Interested in a waterbirth?  This informational class will help you discover whether waterbirth is the right fit for you. Education about waterbirth itself, supplies you would need and how to assemble your support team is what you can expect from this class. Some obstetrical practices require this class in order to pursue a waterbirth. (Not all obstetrical practices offer waterbirth-check with your healthcare provider.) Register only the expectant mom, but you are encouraged to bring your partner to class! Required if planning waterbirth, no fee. Infant/Child CPR: Parents, grandparents, babysitters, and friends learn Cardio-Pulmonary Resuscitation skills for infants and children. You will also learn how to treat both conscious and unconscious choking in infants and children. This Family & Friends program does not offer certification. Register each participant individually to ensure that enough mannequins are available. (Call for fee) Grandparent Love: Expecting a grandbaby? This class is for you! Learn about the latest infant care and safety recommendations and ways to support your own child as he or she transitions into the parenting role. Taught by Registered Nurses who are childbirth instructors, but most importantly...they are grandmothers too! $10/person. Childbirth Class- Natural Childbirth: This series of 5 weekly classes is for expectant parents who want to learn and practice  natural methods of coping with the process of labor and childbirth. Relaxation, breathing, massage, visualization, role of the partner, and helpful positioning are highlighted. Participants learn how to be confident in their body's ability to give birth. This class will empower and help parents make informed decisions about their own care. Includes discussion that will help new parents transition into the immediate postpartum period. Maternity Care Center Tour of [facility] is included. We suggest taking this class between 25-32 weeks, but it's only a recommendation. $75 per registrant and one support person or $30 Medicaid. Childbirth Class- 3 week Series: This option of 3 weekly classes helps you and your labor partner prepare for childbirth. Newborn care, labor & birth, cesarean birth, pain management, and comfort techniques are discussed and a Maternity Care Center Tour of [facility] is included. The class meets at the same time, on the same day of the week for 3 consecutive weeks beginning with the starting date you choose. $60 for registrant and one support person.  Marvelous Multiples: Expecting twins, triplets, or more? This class covers the differences in labor, birth, parenting, and breastfeeding issues that face multiples' parents. NICU tour is included. Led by a Certified Childbirth Educator who is the mother of twins. No fee. Caring for Baby: This class is for expectant and adoptive parents who want to learn and practice the most up-to-date newborn care for their babies. Focus is on birth through the first six weeks of life. Topics include feeding, bathing, diapering, crying, umbilical cord care, circumcision care and safe sleep. Parents learn to  recognize symptoms of illness and when to call the pediatrician. Register only the mom-to-be and your partner or support person can plan to come with you! $10 per registrant and support person Childbirth Class- online option: This online class  offers you the freedom to complete a Birth and Baby series in the comfort of your own home. The flexibility of this option allows you to review sections at your own pace, at times convenient to you and your support people. It includes additional video information, animations, quizzes, and extended activities. Get organized with helpful eClass tools, checklists, and trackers. Once you register online for the class, you will receive an email within a few days to accept the invitation and begin the class when the time is right for you. The content will be available to you for 60 days. $60 for 60 days of online access for you and your support people.         Preterm Labor and Birth Information  The normal length of a pregnancy is 39-41 weeks. Preterm labor is when labor starts before 37 completed weeks of pregnancy. What are the risk factors for preterm labor? Preterm labor is more likely to occur in women who:  Have certain infections during pregnancy such as a bladder infection, sexually transmitted infection, or infection inside the uterus (chorioamnionitis).  Have a shorter-than-normal cervix.  Have gone into preterm labor before.  Have had surgery on their cervix.  Are younger than age 10 or older than age 44.  Are African American.  Are pregnant with twins or multiple babies (multiple gestation).  Take street drugs or smoke while pregnant.  Do not gain enough weight while pregnant.  Became pregnant shortly after having been pregnant. What are the symptoms of preterm labor? Symptoms of preterm labor include:  Cramps similar to those that can happen during a menstrual period. The cramps may happen with diarrhea.  Pain in the abdomen or lower back.  Regular uterine contractions that may feel like tightening of the abdomen.  A feeling of increased pressure in the pelvis.  Increased watery or bloody mucus discharge from the vagina.  Water breaking (ruptured amniotic  sac). Why is it important to recognize signs of preterm labor? It is important to recognize signs of preterm labor because babies who are born prematurely may not be fully developed. This can put them at an increased risk for:  Long-term (chronic) heart and lung problems.  Difficulty immediately after birth with regulating body systems, including blood sugar, body temperature, heart rate, and breathing rate.  Bleeding in the brain.  Cerebral palsy.  Learning difficulties.  Death. These risks are highest for babies who are born before 34 weeks of pregnancy. How is preterm labor treated? Treatment depends on the length of your pregnancy, your condition, and the health of your baby. It may involve:  Having a stitch (suture) placed in your cervix to prevent your cervix from opening too early (cerclage).  Taking or being given medicines, such as: ? Hormone medicines. These may be given early in pregnancy to help support the pregnancy. ? Medicine to stop contractions. ? Medicines to help mature the baby's lungs. These may be prescribed if the risk of delivery is high. ? Medicines to prevent your baby from developing cerebral palsy. If the labor happens before 34 weeks of pregnancy, you may need to stay in the hospital. What should I do if I think I am in preterm labor? If you think that you are going into preterm  labor, call your health care provider right away. How can I prevent preterm labor in future pregnancies? To increase your chance of having a full-term pregnancy:  Do not use any tobacco products, such as cigarettes, chewing tobacco, and e-cigarettes. If you need help quitting, ask your health care provider.  Do not use street drugs or medicines that have not been prescribed to you during your pregnancy.  Talk with your health care provider before taking any herbal supplements, even if you have been taking them regularly.  Make sure you gain a healthy amount of weight during  your pregnancy.  Watch for infection. If you think that you might have an infection, get it checked right away.  Make sure to tell your health care provider if you have gone into preterm labor before. This information is not intended to replace advice given to you by your health care provider. Make sure you discuss any questions you have with your health care provider. Document Revised: 12/12/2018 Document Reviewed: 01/11/2016 Elsevier Patient Education  2020 [employer].       https://www.cdc.gov/vaccines/hcp/vis/vis-statements/tdap.pdf">  Tdap (Tetanus, Diphtheria, Pertussis) Vaccine: What You Need to Know 1. Why get vaccinated? Tdap vaccine can prevent tetanus, diphtheria, and pertussis. Diphtheria and pertussis spread from person to person. Tetanus enters the body through cuts or wounds.  TETANUS (T) causes painful stiffening of the muscles. Tetanus can lead to serious health problems, including being unable to open the mouth, having trouble swallowing and breathing, or death.  DIPHTHERIA (D) can lead to difficulty breathing, heart failure, paralysis, or death.  PERTUSSIS (aP), also known as "whooping cough," can cause uncontrollable, violent coughing which makes it hard to breathe, eat, or drink. Pertussis can be extremely serious in babies and young children, causing pneumonia, convulsions, brain damage, or death. In teens and adults, it can cause weight loss, loss of bladder control, passing out, and rib fractures from severe coughing. 2. Tdap vaccine Tdap is only for children 7 years and older, adolescents, and adults.  Adolescents should receive a single dose of Tdap, preferably at age 18 or 12 years. Pregnant women should get a dose of Tdap during every pregnancy, to protect the newborn from pertussis. Infants are most at risk for severe, life-threatening complications from pertussis. Adults who have never received Tdap should get a dose of Tdap. Also, adults should receive  a booster dose every 10 years, or earlier in the case of a severe and dirty wound or burn. Booster doses can be either Tdap or Td (a different vaccine that protects against tetanus and diphtheria but not pertussis). Tdap may be given at the same time as other vaccines. 3. Talk with your health care provider Tell your vaccine provider if the person getting the vaccine:  Has had an allergic reaction after a previous dose of any vaccine that protects against tetanus, diphtheria, or pertussis, or has any severe, life-threatening allergies.  Has had a coma, decreased level of consciousness, or prolonged seizures within 7 days after a previous dose of any pertussis vaccine (DTP, DTaP, or Tdap).  Has seizures or another nervous system problem.  Has ever had Guillain-Barr Syndrome (also called GBS).  Has had severe pain or swelling after a previous dose of any vaccine that protects against tetanus or diphtheria. In some cases, your health care provider may decide to postpone Tdap vaccination to a future visit.  People with minor illnesses, such as a cold, may be vaccinated. People who are moderately or severely ill should usually wait  until they recover before getting Tdap vaccine.  Your health care provider can give you more information. 4. Risks of a vaccine reaction  Pain, redness, or swelling where the shot was given, mild fever, headache, feeling tired, and nausea, vomiting, diarrhea, or stomachache sometimes happen after Tdap vaccine. People sometimes faint after medical procedures, including vaccination. Tell your provider if you feel dizzy or have vision changes or ringing in the ears.  As with any medicine, there is a very remote chance of a vaccine causing a severe allergic reaction, other serious injury, or death. 5. What if there is a serious problem? An allergic reaction could occur after the vaccinated person leaves the clinic. If you see signs of a severe allergic reaction (hives,  swelling of the face and throat, difficulty breathing, a fast heartbeat, dizziness, or weakness), call 9-1-1 and get the person to the nearest hospital. For other signs that concern you, call your health care provider.  Adverse reactions should be reported to the Vaccine Adverse Event Reporting System (VAERS). Your health care provider will usually file this report, or you can do it yourself. Visit the VAERS website at www.vaers.LAgents.no or call 260-639-0470. VAERS is only for reporting reactions, and VAERS staff do not give medical advice. 6. The National Vaccine Injury Compensation Program The [employer] Vaccine Injury Compensation Program (VICP) is a federal program that was created to compensate people who may have been injured by certain vaccines. Visit the VICP website at SpiritualWord.at or call 3648111965 to learn about the program and about filing a claim. There is a time limit to file a claim for compensation. 7. How can I learn more?  Ask your health care provider.  Call your local or state health department.  Contact the Centers for Disease Control and Prevention (CDC): ? Call (403)768-7548 (1-800-CDC-INFO) or ? Visit CDC's website at PicCapture.uy Vaccine Information Statement Tdap (Tetanus, Diphtheria, Pertussis) Vaccine (12/03/2018) This information is not intended to replace advice given to you by your health care provider. Make sure you discuss any questions you have with your health care provider. Document Revised: 12/12/2018 Document Reviewed: 12/15/2018 Elsevier Patient Education  2020 Elsevier Inc.        Pregnancy and Influenza  Influenza, also called the flu, is an infection of the lungs and airways (respiratory tract). If you are pregnant, you are more likely to catch the flu. You are also more likely to have a more serious case of the flu. This is because pregnancy causes changes to your body's disease-fighting system (immune system),  heart, and lungs. If you develop a bad case of the flu, especially with a high fever, this can cause problems for you and your developing baby. How do people get the flu? The flu is caused by a type of germ called a virus. It spreads when virus particles get passed from person to person by:  Being near a sick person who is coughing or sneezing.  Touching something that has the virus on it and then touching your mouth, nose, or face. The influenza virus is most common during the fall and winter. How can I protect myself against the flu?  Get a flu shot. The best way to prevent the flu is to get a flu shot before flu season starts. The flu shot is not dangerous for your developing baby. It may even help protect your baby from the flu for up to 6 months after birth.  Wash your hands often with soap and warm water. If soap  and water are not available, use hand sanitizer.  Do not come in close contact with sick people.  Do not share food, drinks, or utensils with other people.  Avoid touching your eyes, nose, and mouth.  Clean frequently used surfaces at home, school, or work.  Practice healthy lifestyle habits, such as: ? Eating a healthy, balanced diet. ? Drinking plenty of fluids. ? Exercising regularly or as told by your health care provider. ? Sleeping 7-9 hours each night. ? Finding ways to manage stress. What should I do if I have flu symptoms?  If you have any symptoms of the flu, even after getting a flu shot, contact your health care provider right away.  To reduce fever, take over-the-counter acetaminophen as told by your health care provider.  If you have the flu, you may get antiviral medicine to keep the flu from becoming severe and to shorten how long it lasts.  Avoid spreading the flu to others: ? Stay home until you are well. ? Cover your nose and mouth when you cough or sneeze. ? Wash your hands often. Follow these instructions at home:  Take over-the-counter  and prescription medicines only as told by your health care provider. Do not take any medicine, including cold or flu medicine, unless your health care provider tells you to do so.  If you were prescribed antiviral medicine, take it as told by your health care provider. Do not stop taking the antiviral medicine even if you start to feel better.  Eat a nutrient-rich diet that includes fresh fruits and vegetables, whole grains, lean protein, and low-fat dairy.  Drink enough fluid to keep your urine clear or pale yellow.  Get plenty of rest. Contact a health care provider if:  You have fever or chills.  You have a cough, sore throat, or stuffy nose.  You have worsening or unusual: ? Muscle aches. ? Headache. ? Tiredness. ? Loss of appetite.  You have vomiting or diarrhea. Get help right away if:  You have trouble breathing.  You have chest pain.  You have abdominal pain.  You begin to have labor pains.  You have a fever that does not go down 24 hours after you take medicine.  You do not feel your baby move.  You have diarrhea or vomiting that will not go away.  You have dizziness or confusion.  Your symptoms do not improve, even with treatment. Summary  If you are pregnant, you are more likely to catch the flu. You are also more likely to have a more serious case of the flu.  If you have flu-like symptoms, call your health care provider right away. If you develop a bad case of the flu, especially with a high fever, this can be dangerous for your developing baby.  The best way to prevent the flu is to get a flu shot before flu season starts. The flu shot is not dangerous for your developing baby.  If you have the flu and were prescribed antiviral medicine, take it as told by your health care provider. This information is not intended to replace advice given to you by your health care provider. Make sure you discuss any questions you have with your health care  provider. Document Revised: 12/12/2018 Document Reviewed: 10/16/2016 Elsevier Patient Education  2020 [employer]

## 2020-05-26 LAB — HIV ANTIBODY (ROUTINE TESTING W REFLEX): HIV Screen 4th Generation wRfx: NONREACTIVE

## 2020-05-26 LAB — RPR: RPR Ser Ql: NONREACTIVE

## 2020-05-26 LAB — CBC
Hematocrit: 37.2 % (ref 34.0–46.6)
Hemoglobin: 12.2 g/dL (ref 11.1–15.9)
MCH: 28.6 pg (ref 26.6–33.0)
MCHC: 32.8 g/dL (ref 31.5–35.7)
MCV: 87 fL (ref 79–97)
Platelets: 297 10*3/uL (ref 150–450)
RBC: 4.27 x10E6/uL (ref 3.77–5.28)
RDW: 11.8 % (ref 11.7–15.4)
WBC: 9.5 10*3/uL (ref 3.4–10.8)

## 2020-05-26 LAB — GLUCOSE TOLERANCE, 2 HOURS W/ 1HR
Glucose, 1 hour: 92 mg/dL (ref 65–179)
Glucose, 2 hour: 83 mg/dL (ref 65–152)
Glucose, Fasting: 74 mg/dL (ref 65–91)

## 2020-06-08 ENCOUNTER — Telehealth (INDEPENDENT_AMBULATORY_CARE_PROVIDER_SITE_OTHER): Payer: Medicaid Other | Admitting: Obstetrics and Gynecology

## 2020-06-08 VITALS — BP 112/69 | HR 77

## 2020-06-08 DIAGNOSIS — Z2839 Other underimmunization status: Secondary | ICD-10-CM

## 2020-06-08 DIAGNOSIS — Z3480 Encounter for supervision of other normal pregnancy, unspecified trimester: Secondary | ICD-10-CM

## 2020-06-08 DIAGNOSIS — Z283 Underimmunization status: Secondary | ICD-10-CM

## 2020-06-08 DIAGNOSIS — Z3A29 29 weeks gestation of pregnancy: Secondary | ICD-10-CM

## 2020-06-08 DIAGNOSIS — Z3493 Encounter for supervision of normal pregnancy, unspecified, third trimester: Secondary | ICD-10-CM

## 2020-06-08 NOTE — Progress Notes (Signed)
Pt thinks she may be having some Braxton Hicks ctx when she has been very active.  Pt states happened yesterday and went away with rest.

## 2020-06-08 NOTE — Progress Notes (Signed)
   TELEHEALTH OBSTETRICS VISIT ENCOUNTER NOTE  I connected with Nettye Kinyon on 06/08/20 at 10:35 AM EDT by telephone at home and verified that I am speaking with the correct person using two identifiers.   I discussed the limitations, risks, security and privacy concerns of performing an evaluation and management service by telephone and the availability of in person appointments. I also discussed with the patient that there may be a patient responsible charge related to this service. The patient expressed understanding and agreed to proceed.  Patient is at home, provider is at the office.   Subjective:  Leah Price is a 20 y.o. G2P1001 at [redacted]w[redacted]d being followed for ongoing prenatal care.  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  Patient reports no complaints. Reports fetal movement. Denies any contractions, bleeding or leaking of fluid.   The following portions of the patient's history were reviewed and updated as appropriate: allergies, current medications, past family history, past medical history, past social history, past surgical history and problem list.   Objective:   General:  Alert, oriented and cooperative.   Mental Status: Normal mood and affect perceived. Normal judgment and thought content.  Rest of physical exam deferred due to type of encounter  Assessment and Plan:  Pregnancy: G2P1001 at [redacted]w[redacted]d  1. Rubella non-immune status, antepartum  Will treat PP   2. Supervision of other normal pregnancy, antepartum  BP 112/69 2 hour GTT reviewed.  MFM [place] scheduled  Yesterday she reports 2 braxton hicks contractions while she was deep cleaning her house. She stopped and she drank water and the pain went  away.   Preterm labor symptoms and general obstetric precautions including but not limited to vaginal bleeding, contractions, leaking of fluid and fetal movement were reviewed in detail with the patient.  I discussed the assessment and treatment plan with the patient. The patient was provided an opportunity to ask questions and all were answered. The patient agreed with the plan and demonstrated an understanding of the instructions. The patient was advised to call back or seek an in-person office evaluation/go to MAU at [facility] for any urgent or concerning symptoms. Please refer to After Visit Summary for other counseling recommendations.   I provided 11 minutes of non-face-to-face time during this encounter.  No follow-ups on file.  Future Appointments  Date Time Provider Department Center  06/08/2020 10:35 AM Hiliana Eilts, Harolyn Rutherford, NP [facility] None  06/27/2020  9:45 AM [facility] NURSE [facility]  06/27/2020 10:00 AM [facility] US1 [facility]    Venia Carbon, NP Center for [employer], [facility]

## 2020-06-13 ENCOUNTER — Encounter (HOSPITAL_COMMUNITY): Payer: Self-pay | Admitting: Obstetrics & Gynecology

## 2020-06-13 ENCOUNTER — Other Ambulatory Visit: Payer: Self-pay

## 2020-06-13 ENCOUNTER — Inpatient Hospital Stay (HOSPITAL_COMMUNITY)
Admission: AD | Admit: 2020-06-13 | Discharge: 2020-06-14 | Disposition: A | Discharge status: Home / Self Care | Payer: Medicaid Other | Attending: Obstetrics & Gynecology | Admitting: Obstetrics & Gynecology

## 2020-06-13 ENCOUNTER — Encounter: Payer: Self-pay | Admitting: Medical

## 2020-06-13 DIAGNOSIS — Z148 Genetic carrier of other disease: Secondary | ICD-10-CM | POA: Insufficient documentation

## 2020-06-13 DIAGNOSIS — Z3A30 30 weeks gestation of pregnancy: Secondary | ICD-10-CM | POA: Diagnosis not present

## 2020-06-13 DIAGNOSIS — Z3689 Encounter for other specified antenatal screening: Secondary | ICD-10-CM | POA: Diagnosis not present

## 2020-06-13 DIAGNOSIS — Z87891 Personal history of nicotine dependence: Secondary | ICD-10-CM | POA: Insufficient documentation

## 2020-06-13 DIAGNOSIS — Z283 Underimmunization status: Secondary | ICD-10-CM

## 2020-06-13 DIAGNOSIS — O09899 Supervision of other high risk pregnancies, unspecified trimester: Secondary | ICD-10-CM

## 2020-06-13 LAB — URINALYSIS, ROUTINE W REFLEX MICROSCOPIC
Bilirubin Urine: NEGATIVE
Glucose, UA: NEGATIVE mg/dL
Hgb urine dipstick: NEGATIVE
Ketones, ur: NEGATIVE mg/dL
Nitrite: NEGATIVE
Protein, ur: NEGATIVE mg/dL
Specific Gravity, Urine: 1.006 (ref 1.005–1.030)
pH: 7 (ref 5.0–8.0)

## 2020-06-13 LAB — WET PREP, GENITAL
Clue Cells Wet Prep HPF POC: NONE SEEN
Sperm: NONE SEEN
Trich, Wet Prep: NONE SEEN
Yeast Wet Prep HPF POC: NONE SEEN

## 2020-06-13 LAB — FETAL FIBRONECTIN: Fetal Fibronectin: POSITIVE — AB

## 2020-06-13 MED ORDER — BETAMETHASONE SOD PHOS & ACET 6 (3-3) MG/ML IJ SUSP
12.0000 mg | Freq: Once | INTRAMUSCULAR | Status: AC
  Administered 2020-06-13: 12 mg via INTRAMUSCULAR
  Filled 2020-06-13: qty 5

## 2020-06-13 MED ORDER — NIFEDIPINE 10 MG PO CAPS
10.0000 mg | ORAL_CAPSULE | ORAL | Status: AC
  Administered 2020-06-13 (×2): 10 mg via ORAL
  Filled 2020-06-13 (×3): qty 1

## 2020-06-13 NOTE — MAU Provider Note (Signed)
History     CSN: [id number]  Arrival date and time: 06/13/20 2021   First Provider Initiated Contact with Patient 06/13/20 2104      Chief Complaint  Patient presents with  . Contractions   Ms. Leah Price is a 19 y.o. year old G2P1001 female at [redacted]w[redacted]d weeks gestation who presents to MAU reporting contractions since last night that were every 10 minutes.  She reports that her contractions actually more on Saturday as well but they were more infrequent.  She denies vaginal bleeding or loss of fluid.  She reports no recent intercourse.  Vaginal exam the last 24 hours.  She reports "lots" of fetal movement all day. She receives [facility] with [facility] and is scheduled to be seen again on 06/22/2020.   OB History    Gravida  2   Para  1   Term  1   Preterm      AB      Living  1     SAB      TAB      Ectopic      Multiple      Live Births  1           Past Medical History:  Diagnosis Date  . Alpha thalassemia silent carrier 02/19/2020  . 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 NO PAST SURGERIES      Family History  Problem Relation Age of Onset  . Miscarriages / [place] Mother   . Hyperlipidemia Maternal Grandmother   . ADD / ADHD Neg Hx   . Alcohol abuse Neg Hx   . Anxiety disorder Neg Hx   . Arthritis Neg Hx   . Asthma Neg Hx   . Birth defects Neg Hx   . Cancer Neg Hx   . COPD Neg Hx   . Depression Neg Hx   . Diabetes Neg Hx   . Drug abuse Neg Hx   . Hearing loss Neg Hx   . Heart disease Neg Hx   . Hypertension Neg Hx   . Intellectual disability Neg Hx   . Kidney disease Neg Hx   . Learning disabilities Neg Hx   . Obesity Neg Hx   . Stroke Neg Hx   . Vision loss Neg Hx   . Varicose Veins Neg Hx     Social History   Tobacco Use  . Smoking status: Former Research scientist (life sciences)  . Smokeless tobacco: Never Used  Vaping Use  . Vaping Use: Never used  Substance Use Topics  . Alcohol use: Not  Currently  . Drug use: Never    Allergies: No Known Allergies  Medications Prior to Admission  Medication Sig Dispense Refill Last Dose  . Blood Pressure Monitoring (BLOOD PRESSURE KIT) DEVI 1 kit by Does not apply route as needed. 1 each 0 Past Week at Unknown time  . Prenatal Vit w/Fe-Methylfol-FA (PNV PO) Take by mouth.   06/13/2020 at Unknown time  . ibuprofen (ADVIL,MOTRIN) 600 MG tablet Take 1 tablet (600 mg total) by mouth every 6 (six) hours. (Patient not taking: Reported on 02/04/2020) 30 tablet 0   . metroNIDAZOLE (METROGEL) 0.75 % vaginal gel Place 1 Applicatorful vaginally at bedtime. Apply one applicatorful to vagina at bedtime for 5 days (Patient not taking: Reported on 03/09/2020) 70 g 0   . ondansetron (ZOFRAN ODT) 4 MG disintegrating tablet Take 1 tablet (4 mg total) by mouth every 8 (eight)  hours as needed for nausea or vomiting. (Patient not taking: Reported on 03/09/2020) 30 tablet 1   . predniSONE (STERAPRED UNI-PAK 21 TAB) 10 MG (21) TBPK tablet Take by mouth daily. Take 6 tabs by mouth daily  for 2 days, then 5 tabs for 2 days, then 4 tabs for 2 days, then 3 tabs for 2 days, 2 tabs for 2 days, then 1 tab by mouth daily for 2 days (Patient not taking: Reported on 02/04/2020) 42 tablet 0   . triamcinolone cream (KENALOG) 0.1 % Apply 1 application topically 2 (two) times daily. (Patient not taking: Reported on 02/04/2020) 30 g 0     Review of Systems  Constitutional: Negative.   HENT: Negative.   Eyes: Negative.   Respiratory: Negative.   Cardiovascular: Negative.   Gastrointestinal: Negative.   Endocrine: Negative.   Genitourinary: Positive for pelvic pain (UC's).  Musculoskeletal: Negative.   Skin: Negative.   Allergic/Immunologic: Negative.   Neurological: Negative.   Hematological: Negative.   Psychiatric/Behavioral: Negative.    Physical Exam   Blood pressure 115/68, pulse 94, temperature 98.8 F (37.1 C), temperature source Oral, resp. rate 16, last menstrual  period 11/15/2019, SpO2 100 %, unknown if currently breastfeeding.  Physical Exam Vitals and nursing note reviewed.  Constitutional:      Appearance: Normal appearance. She is normal weight.  HENT:     Head: Normocephalic and atraumatic.     Nose: Nose normal.  Eyes:     Pupils: Pupils are equal, round, and reactive to light.  Cardiovascular:     Rate and Rhythm: Normal rate.     Pulses: Normal pulses.  Pulmonary:     Effort: Pulmonary effort is normal.  Genitourinary:    Comments: Uterus: gravid, S<D, SE: cervix is smooth, pink, no lesions, moderate amt of mucoid, white vaginal d/c -- WP, GC/CT done, cervix: 1/long/soft/posterior, no CMT or friability, no adnexal tenderness  Musculoskeletal:        General: Normal range of motion.     Cervical back: Normal range of motion.  Skin:    General: Skin is warm and dry.  Neurological:     Mental Status: She is alert and oriented to person, place, and time.  Psychiatric:        Mood and Affect: Mood normal.        Behavior: Behavior normal.        Thought Content: Thought content normal.        Judgment: Judgment normal.    REACTIVE NST - FHR: 145 bpm / moderate variability / accels present / decels absent / TOCO: UI noted   Cervical recheck - unchanged MAU Course  Procedures  MDM Wet Prep GC/CT -- Results pending fFN SVE BMZ 12 mg IM  Results for orders placed or performed during the hospital encounter of 06/13/20 (from the past 24 hour(s))  Urinalysis, Routine w reflex microscopic Urine, Clean Catch     Status: Abnormal   Collection Time: 06/13/20  8:36 PM  Result Value Ref Range   Color, Urine STRAW (A) YELLOW   APPearance HAZY (A) CLEAR   Specific Gravity, Urine 1.006 1.005 - 1.030   pH 7.0 5.0 - 8.0   Glucose, UA NEGATIVE NEGATIVE mg/dL   Hgb urine dipstick NEGATIVE NEGATIVE   Bilirubin Urine NEGATIVE NEGATIVE   Ketones, ur NEGATIVE NEGATIVE mg/dL   Protein, ur NEGATIVE NEGATIVE mg/dL   Nitrite NEGATIVE  NEGATIVE   Leukocytes,Ua TRACE (A) NEGATIVE   RBC / HPF 0-5  0 - 5 RBC/hpf   WBC, UA 0-5 0 - 5 WBC/hpf   Bacteria, UA RARE (A) NONE SEEN   Squamous Epithelial / LPF 11-20 0 - 5   Mucus PRESENT   Wet prep, genital     Status: Abnormal   Collection Time: 06/13/20  9:15 PM  Result Value Ref Range   Yeast Wet Prep HPF POC NONE SEEN NONE SEEN   Trich, Wet Prep NONE SEEN NONE SEEN   Clue Cells Wet Prep HPF POC NONE SEEN NONE SEEN   WBC, Wet Prep HPF POC MANY (A) NONE SEEN   Sperm NONE SEEN   Fetal fibronectin     Status: Abnormal   Collection Time: 06/13/20  9:15 PM  Result Value Ref Range   Fetal Fibronectin POSITIVE (A) NEGATIVE    *Consult with Dr. Roselie Awkward @ 2345 - notified of patient's complaints, assessments, lab & NST results, tx plan BMZ in 24 hours and d/c home with PTL precautions and return for BMZ #2 - ok to d/c home, agrees with plan   Assessment and Plan  Preterm labor in third trimester without delivery  - Information provided on PTL and preventing PTL - Please return to MAU for more than 6 contractions in 1 hour or if contractions are more painful, your water breaks or baby stops moving like usual.  - Discharge patient - Return to MAU by 11:00 PM 06/14/2020 - Patient verbalized an understanding of the plan of care and agrees.   Laury Deep, MSN, CNM 06/13/2020, 9:04 PM

## 2020-06-13 NOTE — MAU Note (Signed)
.   Leah Price is a 19 y.o. at [redacted]w[redacted]d here in MAU reporting: ctx that started last night that are 10 minutes apart. No VB or LOF. Endorses good fetal movement. Denies recent intercourse or cervical exam in the last 24 hrs.   Pain score: 7 Vitals:   06/13/20 2036  BP: 115/68  Pulse: 94  Resp: 16  Temp: 98.8 F (37.1 C)  SpO2: 100%     FHT:161 Lab orders placed from triage: UA

## 2020-06-14 ENCOUNTER — Inpatient Hospital Stay (EMERGENCY_DEPARTMENT_HOSPITAL)
Admission: AD | Admit: 2020-06-14 | Discharge: 2020-06-14 | Disposition: A | Discharge status: Home / Self Care | Payer: Medicaid Other | Source: Home / Self Care | Attending: Obstetrics and Gynecology | Admitting: Obstetrics and Gynecology

## 2020-06-14 DIAGNOSIS — Z3A30 30 weeks gestation of pregnancy: Secondary | ICD-10-CM | POA: Insufficient documentation

## 2020-06-14 DIAGNOSIS — Z3689 Encounter for other specified antenatal screening: Secondary | ICD-10-CM

## 2020-06-14 LAB — GC/CHLAMYDIA PROBE AMP (~~LOC~~) NOT AT ARMC
Chlamydia: NEGATIVE
Comment: NEGATIVE
Comment: NORMAL
Neisseria Gonorrhea: NEGATIVE

## 2020-06-14 MED ORDER — NIFEDIPINE ER OSMOTIC RELEASE 30 MG PO TB24: 30.0000 mg | ORAL_TABLET | Freq: Every day | ORAL | 0 refills | Status: DC

## 2020-06-14 MED ORDER — BETAMETHASONE SOD PHOS & ACET 6 (3-3) MG/ML IJ SUSP
12.0000 mg | Freq: Once | INTRAMUSCULAR | Status: AC
  Administered 2020-06-14: 12 mg via INTRAMUSCULAR

## 2020-06-14 NOTE — Discharge Instructions (Signed)
Please return to MAU for more than 6 contractions in 1 hour or if contractions are more painful, your water breaks or baby stops moving like usual.

## 2020-06-14 NOTE — MAU Provider Note (Signed)
First Provider Initiated Contact with Patient 06/14/20 2309      S Ms. Leah Price is a 19 y.o. G2P1001 patient who presents to MAU today for second BMZ injection. Patient doing well, no concerns at this time. She denies contractions, vaginal bleeding or discharge. +FM.   Patient has questions as it relates to normal fetal movement, FMLA paperwork and preterm labor. Answered patient's questions and preterm labor precautions discussed.   O LMP 11/15/2019  Physical Exam Vitals reviewed.  HENT:     Head: Normocephalic.  Cardiovascular:     Rate and Rhythm: Normal rate and regular rhythm.  Pulmonary:     Effort: Pulmonary effort is normal.     Breath sounds: Normal breath sounds.  Skin:    General: Skin is warm and dry.  Neurological:     Mental Status: She is alert and oriented to person, place, and time.  Psychiatric:        Mood and Affect: Mood normal.        Behavior: Behavior normal.        Thought Content: Thought content normal.     A Medical screening exam complete  [redacted] weeks gestation  P Discharge from MAU in stable condition Patient may return to MAU as needed  Follow up in the office for prenatal care  Preterm labor precautions discussed  Hydration and FKC   Sharyon Cable, CNM 06/14/2020 11:19 PM

## 2020-06-16 ENCOUNTER — Other Ambulatory Visit: Payer: Self-pay

## 2020-06-16 ENCOUNTER — Encounter (HOSPITAL_COMMUNITY): Payer: Self-pay | Admitting: Obstetrics & Gynecology

## 2020-06-16 ENCOUNTER — Inpatient Hospital Stay (HOSPITAL_COMMUNITY)
Admission: AD | Admit: 2020-06-16 | Discharge: 2020-06-17 | Disposition: A | Discharge status: Home / Self Care | Payer: Medicaid Other | Attending: Obstetrics & Gynecology | Admitting: Obstetrics & Gynecology

## 2020-06-16 DIAGNOSIS — Z3689 Encounter for other specified antenatal screening: Secondary | ICD-10-CM | POA: Insufficient documentation

## 2020-06-16 DIAGNOSIS — Z3A30 30 weeks gestation of pregnancy: Secondary | ICD-10-CM | POA: Insufficient documentation

## 2020-06-16 DIAGNOSIS — Z87891 Personal history of nicotine dependence: Secondary | ICD-10-CM | POA: Insufficient documentation

## 2020-06-16 DIAGNOSIS — O2343 Unspecified infection of urinary tract in pregnancy, third trimester: Secondary | ICD-10-CM | POA: Diagnosis not present

## 2020-06-16 DIAGNOSIS — O4703 False labor before 37 completed weeks of gestation, third trimester: Secondary | ICD-10-CM

## 2020-06-16 DIAGNOSIS — O98513 Other viral diseases complicating pregnancy, third trimester: Secondary | ICD-10-CM | POA: Insufficient documentation

## 2020-06-16 DIAGNOSIS — Z283 Underimmunization status: Secondary | ICD-10-CM

## 2020-06-16 DIAGNOSIS — O26893 Other specified pregnancy related conditions, third trimester: Secondary | ICD-10-CM | POA: Insufficient documentation

## 2020-06-16 DIAGNOSIS — Z2839 Other underimmunization status: Secondary | ICD-10-CM

## 2020-06-16 DIAGNOSIS — R42 Dizziness and giddiness: Secondary | ICD-10-CM | POA: Diagnosis not present

## 2020-06-16 LAB — URINALYSIS, ROUTINE W REFLEX MICROSCOPIC
Bilirubin Urine: NEGATIVE
Glucose, UA: NEGATIVE mg/dL
Hgb urine dipstick: NEGATIVE
Ketones, ur: NEGATIVE mg/dL
Nitrite: NEGATIVE
Protein, ur: 30 mg/dL — AB
Specific Gravity, Urine: 1.016 (ref 1.005–1.030)
pH: 6 (ref 5.0–8.0)

## 2020-06-16 MED ORDER — CEPHALEXIN 500 MG PO CAPS
500.0000 mg | ORAL_CAPSULE | Freq: Once | ORAL | Status: AC
  Administered 2020-06-17: 500 mg via ORAL
  Filled 2020-06-16: qty 1

## 2020-06-16 MED ORDER — ONDANSETRON 4 MG PO TBDP
4.0000 mg | ORAL_TABLET | Freq: Once | ORAL | Status: AC
  Administered 2020-06-16: 4 mg via ORAL
  Filled 2020-06-16: qty 1

## 2020-06-16 MED ORDER — NIFEDIPINE 10 MG PO CAPS
10.0000 mg | ORAL_CAPSULE | ORAL | Status: DC | PRN
  Administered 2020-06-16: 10 mg via ORAL
  Filled 2020-06-16: qty 1

## 2020-06-16 NOTE — MAU Note (Addendum)
Back pain since this am. Hurts to urinate. Was 1cm two days ago. Having some abdominal pain tonight for couple hours. Nausea and dizziness today. Last BM this am.PT has Procardia but has not taken any. States when her cramping started "I just could not function"

## 2020-06-16 NOTE — MAU Provider Note (Signed)
Chief Complaint:  Contractions   First Provider Initiated Contact with Patient 06/16/20 2222      Uti, tx keflex given in mau, d/c home, kept 2 hours but no contractions after procardia 10 mg so no recheck   HPI: Leah Price is a 19 y.o. G2P1001 at 27w4dby LMP who presents to maternity admissions reporting painful cramping every 15-20 minutes x 2-3 hours. She was seen 06/13/20 in MAU and had positive FFN. She received BMZ on 06/13/20, and was discharged and returned 06/14/20 for second dose. She is taking Procardia 30 XL QHS because it makes her dizzy so is hard to take during the day.  She reports onset of more painful contractions tonight, associated with nausea and dysuria.   She reports good fetal movement.    Location: low abdomen Quality: cramping/tightening Severity: 7/10 on pain scale Duration: 1 days Timing: intermittent, every 15-20 minutes Modifying factors: none Associated signs and symptoms: nausea  HPI  Past Medical History: Past Medical History:  Diagnosis Date  . Alpha thalassemia silent carrier 02/19/2020  . 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 NO PAST SURGERIES      Family History: Family History  Problem Relation Age of Onset  . Miscarriages / [place]   . Hyperlipidemia Maternal Grandmother   . ADD / ADHD Neg Hx   . Alcohol abuse Neg Hx   . Anxiety disorder Neg Hx   . Arthritis Neg Hx   . Asthma Neg Hx   . Birth defects Neg Hx   . Cancer Neg Hx   . COPD Neg Hx   . Depression Neg Hx   . Diabetes Neg Hx   . Drug abuse Neg Hx   . Hearing loss Neg Hx   . Heart disease Neg Hx   .  Hypertension Neg Hx   . Intellectual disability Neg Hx   . Kidney disease Neg Hx   . Learning disabilities Neg Hx   . Obesity Neg Hx   . Stroke Neg Hx   . Vision loss Neg Hx   . Varicose Veins Neg Hx     Social History: Social History   Tobacco Use  . Smoking status: Former SmResearch scientist (life sciences). Smokeless tobacco: Never Used  Vaping Use  . Vaping Use: Never used  Substance Use Topics  . Alcohol use: Not Currently  . Drug use: Never    Allergies: No Known Allergies  Meds:  No medications prior to admission.    ROS:  Review of Systems  Constitutional: Negative for chills, fatigue and fever.  Eyes: Negative for visual disturbance.  Respiratory: Negative for shortness of breath.   Cardiovascular: Negative for chest pain.  Gastrointestinal: Negative for abdominal pain, nausea and vomiting.  Genitourinary: Negative for difficulty urinating, dysuria, flank pain, pelvic pain, vaginal bleeding, vaginal discharge and vaginal pain.  Neurological: Negative for dizziness and headaches.  Psychiatric/Behavioral: Negative.      I have reviewed patient's Past Medical  Hx, Surgical Hx, Family Hx, Social Hx, medications and allergies.   Physical Exam   Patient Vitals for the past 24 hrs:  BP Temp Pulse Resp SpO2 Height Weight  06/17/20 0029 108/67 -- 73 17 98 % -- --  06/16/20 2140 118/74 97.9 F (36.6 C) 78 16 100 % _0  (1.651 m) 60.3 kg   Constitutional: Well-developed, well-nourished female in no acute distress.  Cardiovascular: normal rate Respiratory: normal effort GI: Abd soft, non-tender, gravid appropriate for gestational age.  MS: Extremities nontender, no edema, normal ROM Neurologic: Alert and oriented x 4.  GU: Neg CVAT.    Dilation: 1 Effacement (%): 30 Cervical Position: Posterior Station: -2 Presentation: Vertex Exam by:: L. Leftwich-Kirby,CNM  FHT:  Baseline 145 , moderate variability, accelerations present, no decelerations Contractions: q 20 mins, irregular,  mild to palpation   Labs: Results for orders placed or performed during the hospital encounter of 06/16/20 (from the past 24 hour(s))  Urinalysis, Routine w reflex microscopic Urine, Clean Catch     Status: Abnormal   Collection Time: 06/16/20  9:45 PM  Result Value Ref Range   Color, Urine YELLOW YELLOW   APPearance HAZY (A) CLEAR   Specific Gravity, Urine 1.016 1.005 - 1.030   pH 6.0 5.0 - 8.0   Glucose, UA NEGATIVE NEGATIVE mg/dL   Hgb urine dipstick NEGATIVE NEGATIVE   Bilirubin Urine NEGATIVE NEGATIVE   Ketones, ur NEGATIVE NEGATIVE mg/dL   Protein, ur 30 (A) NEGATIVE mg/dL   Nitrite NEGATIVE NEGATIVE   Leukocytes,Ua MODERATE (A) NEGATIVE   RBC / HPF 0-5 0 - 5 RBC/hpf   WBC, UA 11-20 0 - 5 WBC/hpf   Bacteria, UA RARE (A) NONE SEEN   Squamous Epithelial / LPF 6-10 0 - 5   Mucus PRESENT    B/Positive/-- (06/09 1052)  Imaging:  No results found.  MAU Course/MDM: Orders Placed This Encounter  Procedures  . Culture, OB Urine  . Urinalysis, Routine w reflex microscopic Urine, Clean Catch  . Discharge patient    Meds ordered this encounter  Medications  . NIFEdipine (PROCARDIA) capsule 10 mg  . cephALEXin (KEFLEX) capsule 500 mg  . ondansetron (ZOFRAN-ODT) disintegrating tablet 4 mg  . cefadroxil (DURICEF) 500 MG capsule    Sig: Take 1 capsule (500 mg total) by mouth 2 (two) times daily for 7 days.    Dispense:  14 capsule    Refill:  0    Order Specific Question:   Supervising Provider    Answer:   Verita Schneiders A [[id number]]     NST reviewed and reactive Procardia 10 mg PO x 1 given with UA pending Moderate leukocytes and WBCs on UA, pt with dysuria, will treat for UTI, first dose of Keflex given in MAU, Duricef BID x 7 days ordered Pt with resolution of cramping after Procardia x 1 dose so deferred recheck of cervix D/C home, complete abx course, f/u in office Return to MAU as needed for signs of labor or emergencies.  Assessment: 1. Threatened preterm labor,  third trimester   2. Rubella non-immune status, antepartum   3. UTI (urinary tract infection) during pregnancy, third trimester   4. [redacted] weeks gestation of pregnancy     Plan: Discharge home Labor precautions and fetal kick counts  Follow-up Information    [facility] Follow up.   Why: As scheduled, return to MAU as needed for signs of labor or emergencies Contact information: [place]  [place] [address] [phone]             Allergies as of 06/17/2020   No Known Allergies     Medication List    TAKE these medications   Blood Pressure Kit Devi 1 kit by Does not apply route as needed.   cefadroxil 500 MG capsule Commonly known as: DURICEF Take 1 capsule (500 mg total) by mouth 2 (two) times daily for 7 days.   NIFEdipine 30 MG 24 hr tablet Commonly known as: Procardia XL Take 1 tablet (30 mg total) by mouth daily.   PNV PO Take by mouth.       Fatima Blank Certified Nurse-Midwife 06/17/2020 3:41 AM

## 2020-06-17 DIAGNOSIS — Z283 Underimmunization status: Secondary | ICD-10-CM

## 2020-06-17 DIAGNOSIS — O4703 False labor before 37 completed weeks of gestation, third trimester: Secondary | ICD-10-CM

## 2020-06-17 DIAGNOSIS — Z3A30 30 weeks gestation of pregnancy: Secondary | ICD-10-CM | POA: Diagnosis not present

## 2020-06-17 DIAGNOSIS — O2343 Unspecified infection of urinary tract in pregnancy, third trimester: Secondary | ICD-10-CM | POA: Diagnosis not present

## 2020-06-17 MED ORDER — CEFADROXIL 500 MG PO CAPS: 500.0000 mg | ORAL_CAPSULE | Freq: Two times a day (BID) | ORAL | 0 refills | Status: DC

## 2020-06-18 LAB — CULTURE, OB URINE: Culture: NO GROWTH

## 2020-06-22 ENCOUNTER — Encounter: Payer: Self-pay | Admitting: Obstetrics and Gynecology

## 2020-06-22 ENCOUNTER — Other Ambulatory Visit: Payer: Self-pay

## 2020-06-22 ENCOUNTER — Ambulatory Visit (INDEPENDENT_AMBULATORY_CARE_PROVIDER_SITE_OTHER): Payer: Medicaid Other | Admitting: Obstetrics and Gynecology

## 2020-06-22 VITALS — BP 104/69 | HR 75 | Wt 132.4 lb

## 2020-06-22 DIAGNOSIS — Z3480 Encounter for supervision of other normal pregnancy, unspecified trimester: Secondary | ICD-10-CM

## 2020-06-22 DIAGNOSIS — Z3A31 31 weeks gestation of pregnancy: Secondary | ICD-10-CM

## 2020-06-22 MED ORDER — CEFADROXIL 500 MG PO CAPS: 500.0000 mg | ORAL_CAPSULE | Freq: Two times a day (BID) | ORAL | 0 refills | Status: DC

## 2020-06-22 NOTE — Progress Notes (Signed)
Pt is here for ROB, [redacted]w[redacted]d.   Pt taking Procardia as prescribed for preterm contractions, pt reports she has not felt any contractions since taking the medication.

## 2020-06-22 NOTE — Progress Notes (Signed)
   LOW-RISK PREGNANCY OFFICE VISIT Patient name: Leah Price MRN [id number]  Date of birth: [birth year] Chief Complaint:   Routine Prenatal Visit  History of Present Illness:   Leah Price is a 19 y.o. G70P1001 female at [redacted]w[redacted]d with an Estimated Date of Delivery: 08/21/20 being seen today for ongoing management of a low-risk pregnancy.  Today she reports no complaints.She reports not feeling contractions since she started taking Procardia. Contractions: Irritability. Vag. Bleeding: None.  Movement: Present. denies leaking of fluid. Review of Systems:   Pertinent items are noted in HPI Denies abnormal vaginal discharge w/ itching/odor/irritation, headaches, visual changes, shortness of breath, chest pain, abdominal pain, severe nausea/vomiting, or problems with urination or bowel movements unless otherwise stated above. Pertinent History Reviewed:  Reviewed past medical,surgical, social, obstetrical and family history.  Reviewed problem list, medications and allergies. Physical Assessment:   Vitals:   06/22/20 1055  BP: 104/69  Pulse: 75  Weight: 132 lb 6.4 oz (60.1 kg)  Body mass index is 22.03 kg/m.        Physical Examination:   General appearance: Well appearing, and in no distress  Mental status: Alert, oriented to person, place, and time  Skin: Warm & dry  Cardiovascular: Normal heart rate noted  Respiratory: Normal respiratory effort, no distress  Abdomen: Soft, gravid, nontender  Pelvic: Cervical exam deferred         Extremities: Edema: None  Fetal Status: Fetal Heart Rate (bpm): 150 Fundal Height: 29 cm Movement: Present Presentation: Undeterminable  No results found for this or any previous visit (from the past 24 hour(s)).  Assessment & Plan:  1) Low-risk pregnancy G2P1001 at [redacted]w[redacted]d with an Estimated Date of Delivery: 08/21/20   2) Supervision of other normal pregnancy, antepartum - Plan nv in 2 wks - Advised UCx was negative, so there is no need for office to  resend abx  3) Preterm labor in third trimester without delivery - Continue taking Procardia until instructed to d/c  4) [redacted] weeks gestation of pregnancy    Meds:  Meds ordered this encounter  Medications  . DISCONTD: cefadroxil (DURICEF) 500 MG capsule    Sig: Take 1 capsule (500 mg total) by mouth 2 (two) times daily for 7 days.    Dispense:  14 capsule    Refill:  0   Labs/procedures today: none  Plan:  Continue routine obstetrical care   Reviewed: Preterm labor symptoms and general obstetric precautions including but not limited to vaginal bleeding, contractions, leaking of fluid and fetal movement were reviewed in detail with the patient.  All questions were answered. Has home bp cuff. Check bp weekly, let us know if >140/90.   Follow-up: Return in about 2 weeks (around 07/06/2020) for Return OB visit.  No orders of the defined types were placed in this encounter.  Raelyn Mora MSN, CNM 06/22/2020 11:19 AM

## 2020-06-27 ENCOUNTER — Other Ambulatory Visit: Payer: Self-pay | Admitting: *Deleted

## 2020-06-27 ENCOUNTER — Ambulatory Visit: Payer: Medicaid Other | Attending: Obstetrics and Gynecology

## 2020-06-27 ENCOUNTER — Other Ambulatory Visit: Payer: Self-pay | Admitting: Obstetrics and Gynecology

## 2020-06-27 ENCOUNTER — Other Ambulatory Visit: Payer: Self-pay

## 2020-06-27 ENCOUNTER — Ambulatory Visit: Payer: Medicaid Other | Admitting: *Deleted

## 2020-06-27 ENCOUNTER — Encounter: Payer: Self-pay | Admitting: *Deleted

## 2020-06-27 DIAGNOSIS — O365930 Maternal care for other known or suspected poor fetal growth, third trimester, not applicable or unspecified: Secondary | ICD-10-CM

## 2020-06-27 DIAGNOSIS — O99891 Other specified diseases and conditions complicating pregnancy: Secondary | ICD-10-CM | POA: Insufficient documentation

## 2020-06-27 DIAGNOSIS — Z362 Encounter for other antenatal screening follow-up: Secondary | ICD-10-CM

## 2020-06-27 DIAGNOSIS — Z3A32 32 weeks gestation of pregnancy: Secondary | ICD-10-CM

## 2020-06-27 DIAGNOSIS — Z283 Underimmunization status: Secondary | ICD-10-CM | POA: Insufficient documentation

## 2020-06-27 DIAGNOSIS — Z8759 Personal history of other complications of pregnancy, childbirth and the puerperium: Secondary | ICD-10-CM

## 2020-06-27 DIAGNOSIS — O09293 Supervision of pregnancy with other poor reproductive or obstetric history, third trimester: Secondary | ICD-10-CM | POA: Diagnosis not present

## 2020-06-27 DIAGNOSIS — O09899 Supervision of other high risk pregnancies, unspecified trimester: Secondary | ICD-10-CM

## 2020-07-06 ENCOUNTER — Encounter: Payer: Self-pay | Admitting: *Deleted

## 2020-07-06 ENCOUNTER — Ambulatory Visit: Payer: Medicaid Other | Admitting: *Deleted

## 2020-07-06 ENCOUNTER — Ambulatory Visit (INDEPENDENT_AMBULATORY_CARE_PROVIDER_SITE_OTHER): Payer: Medicaid Other | Admitting: Advanced Practice Midwife

## 2020-07-06 ENCOUNTER — Ambulatory Visit: Payer: Medicaid Other | Attending: Obstetrics and Gynecology

## 2020-07-06 ENCOUNTER — Other Ambulatory Visit: Payer: Self-pay

## 2020-07-06 VITALS — BP 111/79 | HR 87 | Wt 134.0 lb

## 2020-07-06 DIAGNOSIS — O365990 Maternal care for other known or suspected poor fetal growth, unspecified trimester, not applicable or unspecified: Secondary | ICD-10-CM

## 2020-07-06 DIAGNOSIS — Z148 Genetic carrier of other disease: Secondary | ICD-10-CM

## 2020-07-06 DIAGNOSIS — Z3480 Encounter for supervision of other normal pregnancy, unspecified trimester: Secondary | ICD-10-CM

## 2020-07-06 DIAGNOSIS — Z283 Underimmunization status: Secondary | ICD-10-CM | POA: Insufficient documentation

## 2020-07-06 DIAGNOSIS — Z3A33 33 weeks gestation of pregnancy: Secondary | ICD-10-CM

## 2020-07-06 DIAGNOSIS — O365930 Maternal care for other known or suspected poor fetal growth, third trimester, not applicable or unspecified: Secondary | ICD-10-CM

## 2020-07-06 DIAGNOSIS — O09293 Supervision of pregnancy with other poor reproductive or obstetric history, third trimester: Secondary | ICD-10-CM | POA: Diagnosis not present

## 2020-07-06 DIAGNOSIS — O99891 Other specified diseases and conditions complicating pregnancy: Secondary | ICD-10-CM | POA: Insufficient documentation

## 2020-07-06 DIAGNOSIS — G479 Sleep disorder, unspecified: Secondary | ICD-10-CM

## 2020-07-06 DIAGNOSIS — Z2839 Other underimmunization status: Secondary | ICD-10-CM

## 2020-07-06 NOTE — Progress Notes (Signed)
   PRENATAL VISIT NOTE  Subjective:  Leah Price is a 19 y.o. G2P1001 at [redacted]w[redacted]d being seen today for ongoing prenatal care.  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  Patient reports sleep disturbance due to 1 yr old child teething.  Contractions: Irritability. Vag. Bleeding: None.  Movement: Present. Denies leaking of fluid.   The following portions of the patient's history were reviewed and updated as appropriate: allergies, current medications, past family history, past medical history, past social history, past surgical history and problem list.   Objective:   Vitals:   07/06/20 1006  BP: 111/79  Pulse: 87  Weight: 134 lb (60.8 kg)    Fetal Status: Fetal Heart Rate (bpm): 140 Fundal Height: 30 cm Movement: Present     General:  Alert, oriented and cooperative. Patient is in no acute distress.  Skin: Skin is warm and dry. No rash noted.   Cardiovascular: Normal heart rate noted  Respiratory: Normal respiratory effort, no problems with respiration noted  Abdomen: Soft, gravid, appropriate for gestational age.  Pain/Pressure: Absent     Pelvic: Cervical exam deferred        Extremities: Normal range of motion.     Mental Status: Normal mood and affect. Normal behavior. Normal judgment and thought content.   Assessment and Plan:  Pregnancy: G2P1001 at [redacted]w[redacted]d 1. Supervision of other normal pregnancy, antepartum --Fundal height measured at 30cm. Fundal height measures behind, but consistent with previous measurements. [place] scheduled for Monday.  2. [redacted] weeks gestation of pregnancy   3. Pregnancy affected by fetal growth restriction --Anticipatory guidance given,  continue antenatal testing as scheduled --Fetal kick counting reviewed  4. Sleep disturbance --Patient states she's not getting much rest due to her 19 yo teething. Educated on sleep hygiene and taking naps when 19 yo sleeps or nap during the day.  Preterm labor symptoms and general obstetric precautions including but not limited to vaginal bleeding, contractions, leaking of fluid and fetal movement were reviewed in detail with the patient. Please refer to After Visit Summary for other counseling recommendations.   Return in about 2 weeks (around 07/20/2020).  Future Appointments  Date Time Provider Department Center  07/11/2020  9:00 AM [facility] NURSE [facility]  07/11/2020  9:15 AM [facility] US2 [facility]  07/18/2020  9:15 AM [facility] NURSE [facility]  07/18/2020  9:30 AM [facility] US3 [facility]  07/20/2020  2:20 PM Sharyon Cable, CNM [facility] None  07/25/2020 10:15 AM [facility] NURSE [facility]  07/25/2020 10:30 AM [facility] US3 [facility]    Sharen Counter, CNM

## 2020-07-06 NOTE — Progress Notes (Signed)
Pt had u/s this morning.  Is scheduled to continue antenatal testing.

## 2020-07-11 ENCOUNTER — Ambulatory Visit: Payer: Medicaid Other | Admitting: *Deleted

## 2020-07-11 ENCOUNTER — Ambulatory Visit: Payer: Medicaid Other | Attending: Obstetrics and Gynecology

## 2020-07-11 ENCOUNTER — Other Ambulatory Visit: Payer: Self-pay

## 2020-07-11 DIAGNOSIS — O365930 Maternal care for other known or suspected poor fetal growth, third trimester, not applicable or unspecified: Secondary | ICD-10-CM

## 2020-07-11 DIAGNOSIS — Z283 Underimmunization status: Secondary | ICD-10-CM | POA: Diagnosis present

## 2020-07-11 DIAGNOSIS — Z3A34 34 weeks gestation of pregnancy: Secondary | ICD-10-CM

## 2020-07-11 DIAGNOSIS — O09293 Supervision of pregnancy with other poor reproductive or obstetric history, third trimester: Secondary | ICD-10-CM | POA: Diagnosis not present

## 2020-07-11 DIAGNOSIS — Z148 Genetic carrier of other disease: Secondary | ICD-10-CM

## 2020-07-11 DIAGNOSIS — O99891 Other specified diseases and conditions complicating pregnancy: Secondary | ICD-10-CM

## 2020-07-11 DIAGNOSIS — Z2839 Other underimmunization status: Secondary | ICD-10-CM

## 2020-07-18 ENCOUNTER — Other Ambulatory Visit: Payer: Self-pay

## 2020-07-18 ENCOUNTER — Ambulatory Visit: Payer: Medicaid Other | Attending: Obstetrics and Gynecology

## 2020-07-18 ENCOUNTER — Ambulatory Visit: Payer: Medicaid Other | Admitting: *Deleted

## 2020-07-18 ENCOUNTER — Encounter: Payer: Self-pay | Admitting: *Deleted

## 2020-07-18 DIAGNOSIS — O99891 Other specified diseases and conditions complicating pregnancy: Secondary | ICD-10-CM | POA: Diagnosis present

## 2020-07-18 DIAGNOSIS — Z283 Underimmunization status: Secondary | ICD-10-CM | POA: Insufficient documentation

## 2020-07-18 DIAGNOSIS — O365930 Maternal care for other known or suspected poor fetal growth, third trimester, not applicable or unspecified: Secondary | ICD-10-CM

## 2020-07-18 DIAGNOSIS — O09899 Supervision of other high risk pregnancies, unspecified trimester: Secondary | ICD-10-CM

## 2020-07-18 DIAGNOSIS — Z148 Genetic carrier of other disease: Secondary | ICD-10-CM

## 2020-07-18 DIAGNOSIS — Z3A35 35 weeks gestation of pregnancy: Secondary | ICD-10-CM

## 2020-07-18 DIAGNOSIS — O09293 Supervision of pregnancy with other poor reproductive or obstetric history, third trimester: Secondary | ICD-10-CM

## 2020-07-18 DIAGNOSIS — Z362 Encounter for other antenatal screening follow-up: Secondary | ICD-10-CM

## 2020-07-20 ENCOUNTER — Other Ambulatory Visit: Payer: Self-pay

## 2020-07-20 ENCOUNTER — Encounter: Payer: Self-pay | Admitting: Certified Nurse Midwife

## 2020-07-20 ENCOUNTER — Other Ambulatory Visit: Payer: Self-pay | Admitting: Advanced Practice Midwife

## 2020-07-20 ENCOUNTER — Ambulatory Visit (INDEPENDENT_AMBULATORY_CARE_PROVIDER_SITE_OTHER): Payer: Medicaid Other | Admitting: Certified Nurse Midwife

## 2020-07-20 VITALS — BP 108/73 | HR 80 | Wt 138.0 lb

## 2020-07-20 DIAGNOSIS — O99891 Other specified diseases and conditions complicating pregnancy: Secondary | ICD-10-CM

## 2020-07-20 DIAGNOSIS — Z283 Underimmunization status: Secondary | ICD-10-CM

## 2020-07-20 DIAGNOSIS — Z3A35 35 weeks gestation of pregnancy: Secondary | ICD-10-CM

## 2020-07-20 DIAGNOSIS — Z3480 Encounter for supervision of other normal pregnancy, unspecified trimester: Secondary | ICD-10-CM

## 2020-07-20 DIAGNOSIS — Z2839 Other underimmunization status: Secondary | ICD-10-CM

## 2020-07-20 DIAGNOSIS — O365990 Maternal care for other known or suspected poor fetal growth, unspecified trimester, not applicable or unspecified: Secondary | ICD-10-CM

## 2020-07-20 NOTE — Progress Notes (Signed)
Pt is doing well today. Pt had u/s on Monday, is scheduled next week for antenatal testing.  MFM recommend delivery at 37-38 weeks.

## 2020-07-20 NOTE — Progress Notes (Signed)
   PRENATAL VISIT NOTE  Subjective:  Leah Price is a 19 y.o. G2P1001 at [redacted]w[redacted]d being seen today for ongoing prenatal care.  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  Patient reports no complaints.  Contractions: Not present. Vag. Bleeding: None.  Movement: Present. Denies leaking of fluid.   The following portions of the patient's history were reviewed and updated as appropriate: allergies, current medications, past family history, past medical history, past social history, past surgical history and problem list.   Objective:   Vitals:   07/20/20 1401  BP: 108/73  Pulse: 80  Weight: 138 lb (62.6 kg)    Fetal Status: Fetal Heart Rate (bpm): 145 Fundal Height: 30 cm Movement: Present     General:  Alert, oriented and cooperative. Patient is in no acute distress.  Skin: Skin is warm and dry. No rash noted.   Cardiovascular: Normal heart rate noted  Respiratory: Normal respiratory effort, no problems with respiration noted  Abdomen: Soft, gravid, appropriate for gestational age.  Pain/Pressure: Absent     Pelvic: Cervical exam deferred        Extremities: Normal range of motion.     Mental Status: Normal mood and affect. Normal behavior. Normal judgment and thought content.   Assessment and Plan:  Pregnancy: G2P1001 at [redacted]w[redacted]d 1. Supervision of other normal pregnancy, antepartum - Patient doing well, no complaints or concerns at this time  - Anticipatory guidance on upcoming appointments including GBS at next appointment, discussed recommendations of antibiotics during labor if positive   2. Pregnancy affected by  fetal growth restriction - MFM recommends delivery between 37-38 weeks  - Est. FW:    2078  gm      4 lb 9 oz      5  % - IOL scheduled for 11/28, orders placed   3. Rubella non-immune status, antepartum - MMR PP   4. [redacted] weeks gestation of pregnancy  Preterm labor symptoms and general obstetric precautions including but not limited to vaginal bleeding, contractions, leaking of fluid and fetal movement were reviewed in detail with the patient. Please refer to After Visit Summary for other counseling recommendations.   Return in about 1 week (around 07/27/2020) for HROB, in person, GBS.  Future Appointments  Date Time Provider [place]  07/25/2020 10:15 AM [facility] NURSE [facility]  07/25/2020 10:30 AM [facility] US3 [facility]  07/27/2020 11:00 AM Chancy Milroy, MD [place] None    Lajean Manes, CNM

## 2020-07-20 NOTE — Patient Instructions (Signed)
Intrauterine Growth Restriction  Intrauterine growth restriction (IUGR) is when a baby is not growing normally during pregnancy. A baby with IUGR is smaller than it should be and may weigh less than normal at birth. IUGR can result from a problem with the organ that supplies the unborn baby (fetus) with oxygen and nutrition (placenta). Usually, there is no way to prevent this type of problem. Babies with IUGR are at higher risk for early delivery and needing special (intensive) care after birth. What are the causes? The most common cause of IUGR is a problem with the placenta or umbilical cord that causes the fetus to get less oxygen or nutrition than needed. Other causes include:  The mother eating a very unhealthy diet (poor maternal nutrition).  Exposure to chemicals found in substances such as cigarettes, alcohol, and some drugs.  Some prescription medicines.  Other problems that develop in the womb (congenital birth defects).  Genetic disorders.  Infection.  Carrying more than one baby. What increases the risk? This condition is more likely to affect babies of mothers who:  Are over the age of 35 at the time of delivery.  Are younger than age 16 at the time of delivery.  Have medical conditions such as high blood pressure, preeclampsia, diabetes, heart or kidney disease, systemic lupus erythematosus, or anemia.  Live at a very high altitude during pregnancy.  Have a personal history or family history of: ? IUGR. ? A genetic disorder.  Take medicines during pregnancy that are related to congenital disabilities.  Come into contact with infected cat feces (toxoplasmosis).  Come into contact with chickenpox (varicella) or German measles (rubella).  Have or are at risk of getting an infectious disease such as syphilis, HIV, or herpes.  Eat an unhealthy diet during pregnancy.  Weigh less than 100 pounds.  Have had treatments to help her have children (infertility  treatments).  Use tobacco, drugs, or alcohol during pregnancy. What are the signs or symptoms? IUGR does not cause many symptoms. You might notice that your baby does not move or kick very often. Also, your belly may not be as big as expected for the stage of your pregnancy. How is this diagnosed? This condition is diagnosed with physical exams and prenatal exams. You may also have:  Fundal measurements to check the size of your uterus.  An ultrasound done to measure your baby's size compared to the size of other babies at the same stage of development (gestational age). Your health care provider will monitor your baby's growth with ultrasounds throughout pregnancy. You may also have tests to find the cause of IUGR. These may include:  Amniocentesis. This is a procedure that involves passing a needle into the uterus to collect a sample of fluid that surrounds the fetus (amniotic fluid). This may be done to check for signs of infection or congenital defects.  Tests to evaluate blood flow to your baby and placenta. How is this treated? In most cases, the goal of treatment is to treat the cause of IUGR. Your health care providers will monitor your pregnancy closely and help you manage your pregnancy. If your condition is caused by a placenta problem and your baby is not getting enough blood, you may need:  Medicine to start labor and deliver your baby early (induction).  Cesarean delivery, also called a C-section. In this procedure, your baby is delivered through an incision in your abdomen and uterus. Follow these instructions at home: Medicines  Take over-the-counter and prescription medicines   only as told by your health care provider. This includes vitamins and supplements.  Make sure that your health care provider knows about and approves of all the medicines, supplements, vitamins, eye drops, and creams that you use. General instructions  Eat a healthy diet that includes fresh fruits  and vegetables, lean proteins, whole grains, and calcium-rich foods such as milk, yogurt, and dark, leafy greens. Work with your health care provider or a dietitian to make sure that: ? You are getting enough nutrients. ? You are gaining enough weight.  Rest as needed. Try to get at least 8 hours of sleep every night.  Do not drink alcohol or use drugs.  Do not use any products that contain nicotine or tobacco, such as cigarettes and e-cigarettes. If you need help quitting, ask your health care provider.  Keep all follow-up visits as told by your health care provider. This is important. Get help right away if you:  Notice that your baby is moving less than usual or is not moving.  Have contractions that are 5 minutes or less apart, or that increase in frequency, intensity, or length.  Have signs and symptoms of infection, including a fever.  Have vaginal bleeding.  Have increased swelling in your legs, hands, or face.  Have vision changes, including seeing spots or having blurry or double vision.  Have a severe headache that does not go away.  Have sudden, sharp abdominal pain or low back pain.  Have an uncontrolled gush or trickle of fluid from your vagina. Summary  Intrauterine growth restriction (IUGR) is when a baby is not growing normally during pregnancy.  The most common cause of IUGR is a problem with the placenta or umbilical cord that causes the fetus to get less oxygen or nutrition than needed.  This condition is diagnosed with physical and prenatal exams. Your health care provider will monitor your baby's growth with ultrasounds throughout pregnancy.  Make sure that your health care provider knows about and approves of all the medicines, supplements, vitamins, eye drops, and creams that you use. This information is not intended to replace advice given to you by your health care provider. Make sure you discuss any questions you have with your health care  provider. Document Revised: 08/02/2017 Document Reviewed: 06/20/2017 Elsevier Patient Education  2020 Elsevier Inc.  

## 2020-07-21 ENCOUNTER — Encounter (HOSPITAL_COMMUNITY): Payer: Self-pay | Admitting: *Deleted

## 2020-07-21 ENCOUNTER — Telehealth (HOSPITAL_COMMUNITY): Payer: Self-pay | Admitting: *Deleted

## 2020-07-21 NOTE — Telephone Encounter (Signed)
Preadmission screen  

## 2020-07-25 ENCOUNTER — Ambulatory Visit: Payer: Medicaid Other | Attending: Obstetrics

## 2020-07-25 ENCOUNTER — Ambulatory Visit: Payer: Medicaid Other | Admitting: *Deleted

## 2020-07-25 ENCOUNTER — Encounter: Payer: Self-pay | Admitting: *Deleted

## 2020-07-25 ENCOUNTER — Other Ambulatory Visit: Payer: Self-pay

## 2020-07-25 ENCOUNTER — Other Ambulatory Visit: Payer: Self-pay | Admitting: Obstetrics

## 2020-07-25 DIAGNOSIS — O365990 Maternal care for other known or suspected poor fetal growth, unspecified trimester, not applicable or unspecified: Secondary | ICD-10-CM

## 2020-07-25 DIAGNOSIS — O365930 Maternal care for other known or suspected poor fetal growth, third trimester, not applicable or unspecified: Secondary | ICD-10-CM | POA: Insufficient documentation

## 2020-07-25 DIAGNOSIS — O99891 Other specified diseases and conditions complicating pregnancy: Secondary | ICD-10-CM

## 2020-07-25 DIAGNOSIS — Z3A36 36 weeks gestation of pregnancy: Secondary | ICD-10-CM | POA: Diagnosis not present

## 2020-07-25 DIAGNOSIS — O09293 Supervision of pregnancy with other poor reproductive or obstetric history, third trimester: Secondary | ICD-10-CM | POA: Insufficient documentation

## 2020-07-25 DIAGNOSIS — Z2839 Other underimmunization status: Secondary | ICD-10-CM

## 2020-07-25 NOTE — Procedures (Signed)
Leah Price September 16, 2000 [redacted]w[redacted]d  Fetus A Non-Stress Test Interpretation for 07/25/20  Indication: IUGR  Fetal Heart Rate A Mode: External Baseline Rate (A): 130 bpm Variability: Moderate Accelerations: 15 x 15 Decelerations: None Multiple birth?: No  Uterine Activity Mode: Palpation, Toco Contraction Frequency (min): Occas. UI Contraction Quality: Mild Resting Tone Palpated: Relaxed Resting Time: Adequate  Interpretation (Fetal Testing) Nonstress Test Interpretation: Reactive Comments: Dr. Judeth Cornfield reviewed tracing.

## 2020-07-27 ENCOUNTER — Other Ambulatory Visit: Payer: Self-pay

## 2020-07-27 ENCOUNTER — Ambulatory Visit (INDEPENDENT_AMBULATORY_CARE_PROVIDER_SITE_OTHER): Payer: Medicaid Other | Admitting: Obstetrics and Gynecology

## 2020-07-27 ENCOUNTER — Other Ambulatory Visit (HOSPITAL_COMMUNITY)
Admission: RE | Admit: 2020-07-27 | Discharge: 2020-07-27 | Disposition: A | Discharge status: Home / Self Care | Payer: Medicaid Other | Source: Ambulatory Visit | Attending: Obstetrics and Gynecology | Admitting: Obstetrics and Gynecology

## 2020-07-27 ENCOUNTER — Encounter: Payer: Self-pay | Admitting: Obstetrics and Gynecology

## 2020-07-27 VITALS — BP 119/82 | HR 71 | Wt 139.0 lb

## 2020-07-27 DIAGNOSIS — Z3480 Encounter for supervision of other normal pregnancy, unspecified trimester: Secondary | ICD-10-CM | POA: Diagnosis present

## 2020-07-27 DIAGNOSIS — Z3A36 36 weeks gestation of pregnancy: Secondary | ICD-10-CM | POA: Diagnosis present

## 2020-07-27 DIAGNOSIS — O365990 Maternal care for other known or suspected poor fetal growth, unspecified trimester, not applicable or unspecified: Secondary | ICD-10-CM

## 2020-07-27 NOTE — Patient Instructions (Signed)

## 2020-07-27 NOTE — Progress Notes (Signed)
Patient presents for ROB and GBS. Patient has no concerns today. 

## 2020-07-27 NOTE — Progress Notes (Signed)
Subjective:  Leah Price is a 19 y.o. G2P1001 at [redacted]w[redacted]d being seen today for ongoing prenatal care.  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  Patient reports general discomforts of pregnancy.  Contractions: Irritability. Vag. Bleeding: None.  Movement: Present. Denies leaking of fluid.   The following portions of the patient's history were reviewed and updated as appropriate: allergies, current medications, past family history, past medical history, past social history, past surgical history and problem list. Problem list updated.  Objective:   Vitals:   07/27/20 1056  BP: 119/82  Pulse: 71  Weight: 139 lb (63 kg)    Fetal Status: Fetal Heart Rate (bpm): 140   Movement: Present     General:  Alert, oriented and cooperative. Patient is in no acute distress.  Skin: Skin is warm and dry. No rash noted.   Cardiovascular: Normal heart rate noted  Respiratory: Normal respiratory effort, no problems with respiration noted  Abdomen: Soft, gravid, appropriate for gestational age. Pain/Pressure: Absent     Pelvic:  Cervical exam performed        Extremities: Normal range of motion.  Edema: None  Mental Status: Normal mood and affect. Normal behavior. Normal judgment and thought content.   Urinalysis:      Assessment and Plan:  Pregnancy: G2P1001 at [redacted]w[redacted]d  1. Supervision of other normal pregnancy, antepartum Labor precautions IOL 07/31/20 - Strep Gp B NAA - Cervicovaginal ancillary only( [place])  3. Pregnancy affected by fetal growth restriction IOL 07/31/20  Term labor symptoms and general obstetric precautions  including but not limited to vaginal bleeding, contractions, leaking of fluid and fetal movement were reviewed in detail with the patient. Please refer to After Visit Summary for other counseling recommendations.  No follow-ups on file.   Hermina Staggers, MD

## 2020-07-29 ENCOUNTER — Other Ambulatory Visit (HOSPITAL_COMMUNITY)
Admission: RE | Admit: 2020-07-29 | Discharge: 2020-07-29 | Disposition: A | Discharge status: Home / Self Care | Payer: Medicaid Other | Source: Ambulatory Visit | Attending: Family Medicine | Admitting: Family Medicine

## 2020-07-29 DIAGNOSIS — Z01812 Encounter for preprocedural laboratory examination: Secondary | ICD-10-CM | POA: Insufficient documentation

## 2020-07-29 DIAGNOSIS — Z20822 Contact with and (suspected) exposure to covid-19: Secondary | ICD-10-CM | POA: Insufficient documentation

## 2020-07-29 LAB — SARS CORONAVIRUS 2 (TAT 6-24 HRS): SARS Coronavirus 2: NEGATIVE

## 2020-07-29 LAB — STREP GP B NAA: Strep Gp B NAA: NEGATIVE

## 2020-07-30 ENCOUNTER — Other Ambulatory Visit: Payer: Self-pay | Admitting: Advanced Practice Midwife

## 2020-07-31 ENCOUNTER — Inpatient Hospital Stay (HOSPITAL_COMMUNITY): Payer: Medicaid Other | Admitting: Anesthesiology

## 2020-07-31 ENCOUNTER — Inpatient Hospital Stay (HOSPITAL_COMMUNITY): Payer: Medicaid Other

## 2020-07-31 ENCOUNTER — Encounter (HOSPITAL_COMMUNITY): Payer: Self-pay | Admitting: Family Medicine

## 2020-07-31 ENCOUNTER — Other Ambulatory Visit: Payer: Self-pay

## 2020-07-31 ENCOUNTER — Inpatient Hospital Stay (HOSPITAL_COMMUNITY)
Admission: AD | Admit: 2020-07-31 | Discharge: 2020-08-02 | DRG: 807 | Disposition: A | Discharge status: Home / Self Care | Payer: Medicaid Other | Attending: Family Medicine | Admitting: Family Medicine

## 2020-07-31 DIAGNOSIS — O99891 Other specified diseases and conditions complicating pregnancy: Secondary | ICD-10-CM

## 2020-07-31 DIAGNOSIS — Z2839 Other underimmunization status: Secondary | ICD-10-CM

## 2020-07-31 DIAGNOSIS — D563 Thalassemia minor: Secondary | ICD-10-CM | POA: Diagnosis present

## 2020-07-31 DIAGNOSIS — O365930 Maternal care for other known or suspected poor fetal growth, third trimester, not applicable or unspecified: Principal | ICD-10-CM | POA: Diagnosis present

## 2020-07-31 DIAGNOSIS — Z87891 Personal history of nicotine dependence: Secondary | ICD-10-CM | POA: Diagnosis not present

## 2020-07-31 DIAGNOSIS — O365990 Maternal care for other known or suspected poor fetal growth, unspecified trimester, not applicable or unspecified: Secondary | ICD-10-CM | POA: Diagnosis present

## 2020-07-31 DIAGNOSIS — Z3A37 37 weeks gestation of pregnancy: Secondary | ICD-10-CM

## 2020-07-31 LAB — CBC
HCT: 38.5 % (ref 36.0–46.0)
Hemoglobin: 12.5 g/dL (ref 12.0–15.0)
MCH: 27.3 pg (ref 26.0–34.0)
MCHC: 32.5 g/dL (ref 30.0–36.0)
MCV: 84.1 fL (ref 80.0–100.0)
Platelets: 274 10*3/uL (ref 150–400)
RBC: 4.58 MIL/uL (ref 3.87–5.11)
RDW: 13.2 % (ref 11.5–15.5)
WBC: 9 10*3/uL (ref 4.0–10.5)
nRBC: 0 % (ref 0.0–0.2)

## 2020-07-31 LAB — TYPE AND SCREEN
ABO/RH(D): B POS
Antibody Screen: NEGATIVE

## 2020-07-31 LAB — RPR: RPR Ser Ql: NONREACTIVE

## 2020-07-31 MED ORDER — LACTATED RINGERS IV SOLN
500.0000 mL | INTRAVENOUS | Status: DC | PRN
  Administered 2020-07-31: 1000 mL via INTRAVENOUS

## 2020-07-31 MED ORDER — LIDOCAINE HCL (PF) 1 % IJ SOLN
INTRAMUSCULAR | Status: DC | PRN
  Administered 2020-07-31: 5 mL via EPIDURAL

## 2020-07-31 MED ORDER — ONDANSETRON HCL 4 MG PO TABS: 4.0000 mg | ORAL_TABLET | ORAL | Status: DC | PRN

## 2020-07-31 MED ORDER — EPHEDRINE 5 MG/ML INJ: 10.0000 mg | INTRAVENOUS | Status: DC | PRN

## 2020-07-31 MED ORDER — WITCH HAZEL-GLYCERIN EX PADS: 1.0000 "application " | MEDICATED_PAD | CUTANEOUS | Status: DC | PRN

## 2020-07-31 MED ORDER — MEASLES, MUMPS & RUBELLA VAC IJ SOLR: 0.5000 mL | Freq: Once | INTRAMUSCULAR | Status: DC

## 2020-07-31 MED ORDER — LACTATED RINGERS IV SOLN: 500.0000 mL | Freq: Once | INTRAVENOUS | Status: DC

## 2020-07-31 MED ORDER — SENNOSIDES-DOCUSATE SODIUM 8.6-50 MG PO TABS
2.0000 | ORAL_TABLET | ORAL | Status: DC
  Administered 2020-08-01 (×2): 2 via ORAL
  Filled 2020-07-31 (×2): qty 2

## 2020-07-31 MED ORDER — FENTANYL CITRATE (PF) 100 MCG/2ML IJ SOLN: 100.0000 ug | INTRAMUSCULAR | Status: DC | PRN

## 2020-07-31 MED ORDER — PHENYLEPHRINE 40 MCG/ML (10ML) SYRINGE FOR IV PUSH (FOR BLOOD PRESSURE SUPPORT): 80.0000 ug | PREFILLED_SYRINGE | INTRAVENOUS | Status: DC | PRN

## 2020-07-31 MED ORDER — ONDANSETRON HCL 4 MG/2ML IJ SOLN: 4.0000 mg | INTRAMUSCULAR | Status: DC | PRN

## 2020-07-31 MED ORDER — LIDOCAINE HCL (PF) 1 % IJ SOLN: 30.0000 mL | INTRAMUSCULAR | Status: DC | PRN

## 2020-07-31 MED ORDER — DIPHENHYDRAMINE HCL 25 MG PO CAPS: 25.0000 mg | ORAL_CAPSULE | Freq: Four times a day (QID) | ORAL | Status: DC | PRN

## 2020-07-31 MED ORDER — MISOPROSTOL 25 MCG QUARTER TABLET
25.0000 ug | ORAL_TABLET | ORAL | Status: DC | PRN
  Administered 2020-07-31: 25 ug via VAGINAL
  Filled 2020-07-31: qty 1

## 2020-07-31 MED ORDER — SODIUM CHLORIDE (PF) 0.9 % IJ SOLN
INTRAMUSCULAR | Status: DC | PRN
  Administered 2020-07-31: 12 mL/h via EPIDURAL

## 2020-07-31 MED ORDER — TETANUS-DIPHTH-ACELL PERTUSSIS 5-2.5-18.5 LF-MCG/0.5 IM SUSY: 0.5000 mL | PREFILLED_SYRINGE | Freq: Once | INTRAMUSCULAR | Status: DC

## 2020-07-31 MED ORDER — FENTANYL-BUPIVACAINE-NACL 0.5-0.125-0.9 MG/250ML-% EP SOLN
12.0000 mL/h | EPIDURAL | Status: DC | PRN
  Filled 2020-07-31: qty 250

## 2020-07-31 MED ORDER — DIBUCAINE (PERIANAL) 1 % EX OINT: 1.0000 "application " | TOPICAL_OINTMENT | CUTANEOUS | Status: DC | PRN

## 2020-07-31 MED ORDER — OXYTOCIN-SODIUM CHLORIDE 30-0.9 UT/500ML-% IV SOLN
2.5000 [IU]/h | INTRAVENOUS | Status: DC
  Filled 2020-07-31: qty 500

## 2020-07-31 MED ORDER — PRENATAL MULTIVITAMIN CH
1.0000 | ORAL_TABLET | Freq: Every day | ORAL | Status: DC
  Administered 2020-08-01 – 2020-08-02 (×2): 1 via ORAL
  Filled 2020-07-31: qty 1

## 2020-07-31 MED ORDER — SIMETHICONE 80 MG PO CHEW: 80.0000 mg | CHEWABLE_TABLET | ORAL | Status: DC | PRN

## 2020-07-31 MED ORDER — PHENYLEPHRINE 40 MCG/ML (10ML) SYRINGE FOR IV PUSH (FOR BLOOD PRESSURE SUPPORT)
80.0000 ug | PREFILLED_SYRINGE | INTRAVENOUS | Status: DC | PRN
  Filled 2020-07-31: qty 10

## 2020-07-31 MED ORDER — SOD CITRATE-CITRIC ACID 500-334 MG/5ML PO SOLN: 30.0000 mL | ORAL | Status: DC | PRN

## 2020-07-31 MED ORDER — COCONUT OIL OIL: 1.0000 "application " | TOPICAL_OIL | Status: DC | PRN

## 2020-07-31 MED ORDER — DIPHENHYDRAMINE HCL 50 MG/ML IJ SOLN: 12.5000 mg | INTRAMUSCULAR | Status: DC | PRN

## 2020-07-31 MED ORDER — ACETAMINOPHEN 325 MG PO TABS: 650.0000 mg | ORAL_TABLET | ORAL | Status: DC | PRN

## 2020-07-31 MED ORDER — ACETAMINOPHEN 325 MG PO TABS
650.0000 mg | ORAL_TABLET | ORAL | Status: DC | PRN
  Administered 2020-08-01 (×2): 650 mg via ORAL
  Filled 2020-07-31 (×2): qty 2

## 2020-07-31 MED ORDER — MISOPROSTOL 50MCG HALF TABLET
50.0000 ug | ORAL_TABLET | ORAL | Status: DC
  Administered 2020-07-31: 50 ug via ORAL
  Filled 2020-07-31: qty 1

## 2020-07-31 MED ORDER — LACTATED RINGERS IV SOLN: INTRAVENOUS | Status: DC

## 2020-07-31 MED ORDER — IBUPROFEN 600 MG PO TABS
600.0000 mg | ORAL_TABLET | Freq: Four times a day (QID) | ORAL | Status: DC
  Administered 2020-08-01 – 2020-08-02 (×7): 600 mg via ORAL
  Filled 2020-07-31 (×6): qty 1

## 2020-07-31 MED ORDER — OXYTOCIN BOLUS FROM INFUSION: 333.0000 mL | Freq: Once | INTRAVENOUS | Status: DC

## 2020-07-31 MED ORDER — TERBUTALINE SULFATE 1 MG/ML IJ SOLN: 0.2500 mg | Freq: Once | INTRAMUSCULAR | Status: DC | PRN

## 2020-07-31 MED ORDER — BENZOCAINE-MENTHOL 20-0.5 % EX AERO
1.0000 "application " | INHALATION_SPRAY | CUTANEOUS | Status: DC | PRN
  Administered 2020-08-02: 1 via TOPICAL
  Filled 2020-07-31 (×2): qty 56

## 2020-07-31 MED ORDER — ONDANSETRON HCL 4 MG/2ML IJ SOLN: 4.0000 mg | Freq: Four times a day (QID) | INTRAMUSCULAR | Status: DC | PRN

## 2020-07-31 MED ORDER — FENTANYL-BUPIVACAINE-NACL 0.5-0.125-0.9 MG/250ML-% EP SOLN: 12.0000 mL/h | EPIDURAL | Status: DC | PRN

## 2020-07-31 NOTE — H&P (Addendum)
OBSTETRIC ADMISSION HISTORY AND PHYSICAL  Leah Price is a 19 y.o. female G2P1001 with IUP at 23w0dby [place] for IOL 2/2 FGR. She reports +FMs, No LOF, no VB, no blurry vision, headaches or peripheral edema, and RUQ pain.  She plans on  feeding. She request POPs for birth control. 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    Past Surgical History: Past Surgical History:  Procedure Laterality Date   NO PAST SURGERIES      Obstetrical History: OB History     Gravida  2   Para  1   Term  1   Preterm      AB      Living  1      SAB      TAB      Ectopic      Multiple      Live Births  1           Social History Social History   Socioeconomic History   Marital status: Single    Spouse name: AElberta Fortis  Number of children: 1   Years of education: Not on file   Highest education level: 12th grade  Occupational History   Not on file  Tobacco Use   Smoking status: Former Smoker   Smokeless tobacco: Never Used  VScientific laboratory technicianUse: Never used  Substance and Sexual Activity   Alcohol use: Not Currently   Drug use: Never   Sexual activity: Yes    Birth control/protection: None  Other Topics Concern   Not on file  Social History Narrative   Not on file   Social Determinants of Health   Financial Resource Strain:    Difficulty of Paying Living Expenses: Not on file  Food Insecurity:    Worried About RCharity fundraiserin the Last Year: Not on file    RYRC Worldwideof Food in the Last Year: Not on file  Transportation Needs:    Lack of Transportation (Medical): Not on file   Lack of Transportation (Non-Medical): Not on file  Physical Activity:    Days of Exercise per Week: Not on file   Minutes of Exercise per Session: Not on file  Stress:    Feeling of Stress : Not on file  Social Connections:    Frequency of Communication with Friends and Family: Not on file   Frequency of Social Gatherings with Friends and Family: Not on file   Attends Religious Services: Not on file   Active Member of Clubs or Organizations: Not on file   Attends CArchivistMeetings: Not on file   Marital Status: Not on file    Family History: Family History  Problem Relation Age of Onset   Miscarriages / Stillbirths Mother    Hyperlipidemia Maternal Grandmother    ADD / ADHD Neg Hx    Alcohol abuse Neg Hx    Anxiety disorder Neg Hx    Arthritis Neg Hx    Asthma Neg Hx  Birth defects Neg Hx    Cancer Neg Hx    COPD Neg Hx    Depression Neg Hx    Diabetes Neg Hx    Drug abuse Neg Hx    Hearing loss Neg Hx    Heart disease Neg Hx    Hypertension Neg Hx    Intellectual disability Neg Hx    Kidney disease Neg Hx    Learning disabilities Neg Hx    Obesity Neg Hx    Stroke Neg Hx    Vision loss Neg Hx    Varicose Veins Neg Hx     Allergies: No Known Allergies  Medications Prior to Admission  Medication Sig Dispense Refill Last Dose   Blood Pressure Monitoring (BLOOD PRESSURE KIT) DEVI 1 kit by Does not apply route as needed. 1 each 0 Past Week at Unknown time   Prenatal Vit w/Fe-Methylfol-FA (PNV PO) Take by mouth.   07/30/2020 at Unknown time     Review of Systems   All systems reviewed and negative except as stated in HPI  Blood pressure 108/73, pulse 81, temperature 98.7 F (37.1 C), height _0  (1.651 m), weight 62.6 kg, last menstrual period 11/15/2019, unknown if currently breastfeeding. General appearance: alert,  cooperative, appears stated age and no distress Lungs: normal effort Abdomen: soft, non-tender; bowel sounds normal Pelvic: 2/40/-3 Extremities: Homans sign is negative, no sign of DVT Presentation: cephalic Fetal monitoringBaseline: 140 bpm, Variability: Good {> 6 bpm), Accelerations: Reactive and Decelerations: Absent Uterine activity: minimal irritability    Prenatal labs: ABO, Rh: --/--/PENDING (11/28 1610) Antibody: PENDING (11/28 0835) Rubella: <0.90 (06/09 1052) RPR: Non Reactive (09/22 0936)  HBsAg: Negative (06/09 1052)  HIV: Non Reactive (09/22 0936)  GBS: Negative/-- (11/24 1123)  1 hr Glucola 92; wnl Genetic screening Low risk Anatomy US wnl, anterior placenta   Prenatal Transfer Tool  Maternal Diabetes: No Genetic Screening: Normal Maternal Ultrasounds/Referrals: IUGR Fetal Ultrasounds or other Referrals:  Referred to Materal Fetal Medicine  Maternal Substance Abuse:  No Significant Maternal Medications:  None Significant Maternal Lab Results: Group B Strep negative  Results for orders placed or performed during the hospital encounter of 07/31/20 (from the past 24 hour(s))  CBC   Collection Time: 07/31/20  8:32 AM  Result Value Ref Range   WBC 9.0 4.0 - 10.5 K/uL   RBC 4.58 3.87 - 5.11 MIL/uL   Hemoglobin 12.5 12.0 - 15.0 g/dL   HCT 38.5 36 - 46 %   MCV 84.1 80.0 - 100.0 fL   MCH 27.3 26.0 - 34.0 pg   MCHC 32.5 30.0 - 36.0 g/dL   RDW 13.2 11.5 - 15.5 %   Platelets 274 150 - 400 K/uL   nRBC 0.0 0.0 - 0.2 %  Type and screen   Collection Time: 07/31/20  8:35 AM  Result Value Ref Range   ABO/RH(D) PENDING    Antibody Screen PENDING    Sample Expiration      08/03/2020,2359 Performed at [facility], [place] [address] [place], [place] [address]    Patient Active Problem List   Diagnosis Date Noted   Pregnancy affected by fetal growth restriction 07/20/2020   Preterm labor in third trimester without delivery 06/22/2020   Rubella non-immune status,  antepartum 05/04/2020   Alpha thalassemia silent carrier 02/19/2020   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 G2P1001 at 28w0dhere for IOL 2/2 FGR.   #Labor: Vaginal cytotec x1, FB placed. Consider repeating cytotec, starting pitocin/AROM when appropriate.  #Pain: Maternally supported, not planning for epidural #FWB: Cat I #ID: GBS neg #MOF: Breast #MOC: POPs #Circ: N/A  Simone Autry-Lott, DO  07/31/2020, 9:21 AM  I spoke with and examined patient and agree with resident/PA-S/MS/SNM's note and plan of care.  KRoma Schanz CNM, [facility]11/28/2021 10:09 AM

## 2020-07-31 NOTE — Anesthesia Preprocedure Evaluation (Signed)
Anesthesia Evaluation  Patient identified by MRN, date of birth, ID band Patient awake    Reviewed: Allergy & Precautions, Patient's Chart, lab work & pertinent test results  Airway Mallampati: II       Dental   Pulmonary former smoker,    Pulmonary exam normal        Cardiovascular negative cardio ROS Normal cardiovascular exam     Neuro/Psych negative neurological ROS  negative psych ROS   GI/Hepatic Neg liver ROS,   Endo/Other    Renal/GU      Musculoskeletal   Abdominal   Peds  Hematology   Anesthesia Other Findings   Reproductive/Obstetrics (+) Pregnancy                             Anesthesia Physical Anesthesia Plan  ASA: II  Anesthesia Plan: Epidural   Post-op Pain Management:    Induction:   PONV Risk Score and Plan:   Airway Management Planned: Natural Airway  Additional Equipment: None  Intra-op Plan:   Post-operative Plan:   Informed Consent: I have reviewed the patients History and Physical, chart, labs and discussed the procedure including the risks, benefits and alternatives for the proposed anesthesia with the patient or authorized representative who has indicated his/her understanding and acceptance.       Plan Discussed with:   Anesthesia Plan Comments: (Lab Results      Component                Value               Date                      WBC                      9.0                 07/31/2020                HGB                      12.5                07/31/2020                HCT                      38.5                07/31/2020                MCV                      84.1                07/31/2020                PLT                      274                 07/31/2020           )        Anesthesia Quick Evaluation

## 2020-07-31 NOTE — Discharge Summary (Signed)
Postpartum Discharge Summary      Patient Name: Leah Price DOB: 2001-07-27 MRN: [id number]  Date of admission: 07/31/2020 Delivery date:07/31/2020  Delivering provider: Gerlene Fee  Date of discharge: 08/02/2020  Admitting diagnosis: Pregnancy affected by fetal growth restriction [O36.5990] Intrauterine pregnancy: [redacted]w[redacted]d    Secondary diagnosis:  Active Problems:   Pregnancy affected by fetal growth restriction  Additional problems: none    Discharge diagnosis: Term Pregnancy Delivered                                              Post partum procedures:none Augmentation: AROM, Cytotec and IP Foley Complications: None  Hospital course: Induction of Labor With Vaginal Delivery   19y.o. yo G2P1001 at 312w0das admitted to the hospital 07/31/2020 for induction of labor.  Indication for induction: FGR 5%.  Patient had an uncomplicated labor course as follows: Membrane Rupture Time/Date: 4:18 PM ,07/31/2020   Delivery Method:Vaginal, Spontaneous  Episiotomy: None  Lacerations:  None  Details of delivery can be found in separate delivery note.  Patient had a routine postpartum course. Patient is discharged home 08/02/20.  Newborn Data: Birth date:07/31/2020  Birth time:4:33 PM  Gender:Female  Living status:Living  Apgars:7 ,9  Weight:2271 g   Magnesium Sulfate received: No BMZ received: No Rhophylac:N/A MMFAO:ZHYQMVH-DaP:Given prenatally Flu: No Transfusion:No  Physical exam  Vitals:   08/01/20 0841 08/01/20 1335 08/01/20 2013 08/02/20 0600  BP: 110/74 126/70 107/72 104/70  Pulse: 60 79 84 81  Resp: _0 Temp: (!) 97.5 F (36.4 C) 98.4 F (36.9 C)  98.1 F (36.7 C)  TempSrc: Oral Oral  Oral  SpO2: 100% 99% 99% 99%  Weight:      Height:       General: alert, cooperative and no distress Lochia: appropriate Uterine Fundus: firm Incision: N/A DVT Evaluation: No evidence of DVT seen on physical exam. Negative Homan's sign. No cords or  calf tenderness. Labs: Lab Results  Component Value Date   WBC 9.0 07/31/2020   HGB 12.5 07/31/2020   HCT 38.5 07/31/2020   MCV 84.1 07/31/2020   PLT 274 07/31/2020   No flowsheet data found. Edinburgh Score: Edinburgh Postnatal Depression Scale Screening Tool 08/01/2020  I have been able to laugh and see the funny side of things. 0  I have looked forward with enjoyment to things. 0  I have blamed myself unnecessarily when things went wrong. 0  I have been anxious or worried for no good reason. 0  I have felt scared or panicky for no good reason. 0  Things have been getting on top of me. 0  I have been so unhappy that I have had difficulty sleeping. 0  I have felt sad or miserable. 0  I have been so unhappy that I have been crying. 0  The thought of harming myself has occurred to me. 0  Edinburgh Postnatal Depression Scale Total 0     After visit meds:  Allergies as of 08/02/2020   No Known Allergies     Medication List    STOP taking these medications   Blood Pressure Kit Devi     TAKE these medications   ibuprofen 600 MG tablet Commonly known as: ADVIL Take 1 tablet (600 mg total) by mouth every 6 (six) hours.   norethindrone 0.35 MG tablet Commonly  known as: Ortho Micronor Take 1 tablet (0.35 mg total) by mouth daily. Start taking the Sunday after the baby turns 3 weeks old   PNV PO Take by mouth.        Discharge home in stable condition Infant Feeding: Breast Infant Disposition:home with mother Discharge instruction: per After Visit Summary and Postpartum booklet. Activity: Advance as tolerated. Pelvic rest for 6 weeks.  Diet: routine diet Future Appointments: Future Appointments  Date Time Provider Department Center  08/30/2020  2:20 PM Leftwich-Kirby, Lisa A, CNM [facility] None    Follow up Visit:  Follow-up Information    [facility] Follow up on 08/30/2020.   Specialty: Obstetrics and Gynecology Why: for  postpartum check up Contact information: [address], [address] [place] [address] [phone]             Booker, Kimberly R, CNM  P [facility] Please schedule this patient for PP visit in: 4 weeks  High risk pregnancy complicated by: FGR  Delivery mode: SVD  Anticipated Birth Control: POPs  PP Procedures needed: none  Schedule Integrated BH visit: no  Provider: Any provider    08/02/2020 Frances Cresenzo-Dishmon, CNM   

## 2020-07-31 NOTE — Progress Notes (Signed)
LABOR PROGRESS NOTE  Leah Price is a 19 y.o. G2P1001 at [redacted]w[redacted]d  admitted for IOL 2/2 FGR.   Subjective: Feeling better with her epidural in place.   Objective: BP 111/73 (BP Location: Right Arm)   Pulse 76   Temp 98.2 F (36.8 C)   Resp 17   Ht 5\' 5"  (1.651 m)   Wt 62.6 kg   LMP 11/15/2019   BMI 22.96 kg/m  or  Vitals:   07/31/20 1551 07/31/20 1556 07/31/20 1600 07/31/20 1606  BP: 121/80 113/78 109/71 111/73  Pulse: 88 73 74 76  Resp: 16 17 16 17   Temp:      Weight:      Height:       Dilation: 7 Effacement (%): 80 Station: 0 Presentation: Vertex Exam by:: MD FHT: baseline rate 135 bpm, moderate varibility, 15 x15 acel, variable- early decels Toco: 1-2 mins  Labs: Lab Results  Component Value Date   WBC 9.0 07/31/2020   HGB 12.5 07/31/2020   HCT 38.5 07/31/2020   MCV 84.1 07/31/2020   PLT 274 07/31/2020    Patient Active Problem List   Diagnosis Date Noted  . Pregnancy affected by fetal growth restriction 07/20/2020  . Preterm labor in third trimester without delivery 06/22/2020  . Rubella non-immune status, antepartum 05/04/2020  . Alpha thalassemia silent carrier 02/19/2020  . 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 Supervision of other normal pregnancy, antepartum 02/04/2020  . History of prior pregnancy with IUGR newborn 08/15/2018    Assessment / Plan: 19 y.o. G2P1001 at [redacted]w[redacted]d here for IOL 2/2 FGR.  Labor: s/p cytotec x2 and FB. Making good cervical change. AROM.  Fetal Wellbeing: Cat II; cervical check and position changes and likely due to rapid change after AROM.  Pain Control:  Epidural Anticipated MOD: Vaginal  Jesusita Jocelyn Autry-Lott, DO 07/31/2020, 4:27 PM PGY-2, [facility]

## 2020-07-31 NOTE — Progress Notes (Signed)
LABOR PROGRESS NOTE  Leah Price is a 19 y.o. G2P1001 at [redacted]w[redacted]d  admitted for IOL 2/2 FGR.   Subjective: Doing well, walking the halls. Endorsing discomfort with contractions.   Objective: BP 109/74 (BP Location: Right Arm)   Pulse 79   Temp 98.2 F (36.8 C)   Resp 17   Ht 5\' 5"  (1.651 m)   Wt 62.6 kg   LMP 11/15/2019   BMI 22.96 kg/m  or  Vitals:   07/31/20 0908 07/31/20 1037 07/31/20 1152 07/31/20 1309  BP: 108/73 105/78 107/75 109/74  Pulse: 81 74 66 79  Resp: 16 17 16 17   Temp: 98.7 F (37.1 C)   98.2 F (36.8 C)  Weight:      Height:       Dilation: 2 Effacement (%): 40 Station: -3 Presentation: Vertex Exam by:: MD FHT: baseline rate 130 bpm, moderate varibility, 15 x 15 acel, no decel Toco: 2 mins  Labs: Lab Results  Component Value Date   WBC 9.0 07/31/2020   HGB 12.5 07/31/2020   HCT 38.5 07/31/2020   MCV 84.1 07/31/2020   PLT 274 07/31/2020    Patient Active Problem List   Diagnosis Date Noted  . Pregnancy affected by fetal growth restriction 07/20/2020  . Preterm labor in third trimester without delivery 06/22/2020  . Rubella non-immune status, antepartum 05/04/2020  . Alpha thalassemia silent carrier 02/19/2020  . 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 Supervision of other normal pregnancy, antepartum 02/04/2020  . History of prior pregnancy with IUGR newborn 08/15/2018    Assessment / Plan: 19 y.o. G2P1001 at [redacted]w[redacted]d here for IOL 2/2 FGR.   Labor: s/p vag cyto x1, FB in place. Continue buccal cytotec and consider starting pitocin/AROM when appropriate Fetal Wellbeing: Cat I Pain Control:  Maternally supported, not planning for epidural Anticipated MOD: Vaginal  36, DO 07/31/2020, 1:26 PM PGY-2, [facility]

## 2020-07-31 NOTE — Progress Notes (Signed)
LABOR PROGRESS NOTE  Leah Price is a 19 y.o. G2P1001 at [redacted]w[redacted]d  admitted for IOL 2/2 FGR.   Subjective: Breathing through contractions, would like an epidural.   Objective: BP 103/73 (BP Location: Right Arm)   Pulse 75   Temp 98.2 F (36.8 C)   Resp 17   Ht 5\' 5"  (1.651 m)   Wt 62.6 kg   LMP 11/15/2019   BMI 22.96 kg/m  or  Vitals:   07/31/20 1152 07/31/20 1309 07/31/20 1401 07/31/20 1504  BP: 107/75 109/74 113/67 103/73  Pulse: 66 79 81 75  Resp: 16 17 16 17   Temp:  98.2 F (36.8 C)    Weight:      Height:       Dilation: 5.5 Effacement (%): 80 Station: -1 Presentation: Vertex Exam by:: MD FHT: baseline rate 120 bpm, moderate varibility, 15 x 15 acel, no decel Toco: 1-2 mins  Labs: Lab Results  Component Value Date   WBC 9.0 07/31/2020   HGB 12.5 07/31/2020   HCT 38.5 07/31/2020   MCV 84.1 07/31/2020   PLT 274 07/31/2020   Patient Active Problem List   Diagnosis Date Noted  . Pregnancy affected by fetal growth restriction 07/20/2020  . Preterm labor in third trimester without delivery 06/22/2020  . Rubella non-immune status, antepartum 05/04/2020  . Alpha thalassemia silent carrier 02/19/2020  . 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 Supervision of other normal pregnancy, antepartum 02/04/2020  . History of prior pregnancy with IUGR newborn 08/15/2018   Assessment / Plan: 19 y.o. G2P1001 at [redacted]w[redacted]d here for IOL 2/2 FGR.   Labor: Making good cervical change. S/p FB, cytotec x2. Consider AROM after patient's epidural.  Fetal Wellbeing:  Cat I Pain Control:  Patient would like epidural now Anticipated MOD: Vaginal  Renelle Stegenga Autry-Lott, DO 07/31/2020, 3:16 PM PGY-2, [facility]

## 2020-07-31 NOTE — Anesthesia Procedure Notes (Signed)
Epidural Patient location during procedure: OB Start time: 07/31/2020 3:31 PM End time: 07/31/2020 3:37 PM  Staffing Anesthesiologist: Shelton Silvas, MD Performed: anesthesiologist   Preanesthetic Checklist Completed: patient identified, IV checked, site marked, risks and benefits discussed, surgical consent, monitors and equipment checked, pre-op evaluation and timeout performed  Epidural Patient position: sitting Prep: DuraPrep Patient monitoring: heart rate, continuous pulse ox and blood pressure Approach: midline Location: L3-L4 Injection technique: LOR saline  Needle:  Needle type: Tuohy  Needle gauge: 17 G Needle length: 9 cm Catheter type: closed end flexible Catheter size: 20 Guage Test dose: negative and 1.5% lidocaine  Assessment Events: blood not aspirated, injection not painful, no injection resistance and no paresthesia  Additional Notes LOR @ 5  Patient identified. Risks/Benefits/Options discussed with patient including but not limited to bleeding, infection, nerve damage, paralysis, failed block, incomplete pain control, headache, blood pressure changes, nausea, vomiting, reactions to medications, itching and postpartum back pain. Confirmed with bedside nurse the patient's most recent platelet count. Confirmed with patient that they are not currently taking any anticoagulation, have any bleeding history or any family history of bleeding disorders. Patient expressed understanding and wished to proceed. All questions were answered. Sterile technique was used throughout the entire procedure. Please see nursing notes for vital signs. Test dose was given through epidural catheter and negative prior to continuing to dose epidural or start infusion. Warning signs of high block given to the patient including shortness of breath, tingling/numbness in hands, complete motor block, or any concerning symptoms with instructions to call for help. Patient was given instructions on  fall risk and not to get out of bed. All questions and concerns addressed with instructions to call with any issues or inadequate analgesia.    Reason for block:procedure for pain

## 2020-08-01 LAB — CERVICOVAGINAL ANCILLARY ONLY
Chlamydia: NEGATIVE
Comment: NEGATIVE
Comment: NEGATIVE
Comment: NORMAL
Neisseria Gonorrhea: NEGATIVE
Trichomonas: NEGATIVE

## 2020-08-01 NOTE — Progress Notes (Signed)
POSTPARTUM PROGRESS NOTE  Subjective: Leah Price is a 19 y.o. [id number] s/p SVD at [redacted]w[redacted]d.  She reports she doing well. No acute events overnight. She denies any problems with ambulating, voiding or po intake. Denies nausea or vomiting. She has passed flatus. Pain is well controlled.  Lochia is minimal.  Objective: Blood pressure 110/74, pulse 60, temperature (!) 97.5 F (36.4 C), temperature source Oral, resp. rate 18, height 5\' 5"  (1.651 m), weight 62.6 kg, last menstrual period 11/15/2019, SpO2 100 %, unknown if currently breastfeeding.  Physical Exam:  General: alert, cooperative and no distress Chest: no respiratory distress Abdomen: soft, non-tender  Uterine Fundus: firm and at level of umbilicus Extremities: No calf swelling or tenderness  No edema  Recent Labs    07/31/20 0832  HGB 12.5  HCT 38.5    Assessment/Plan: Leah Price is a 19 y.o. 36 s/p SVDat [redacted]w[redacted]d, induced for IUGR  Routine Postpartum Care: Doing well, pain well-controlled.  -- Continue routine care, lactation support  -- Contraception: POPs -- Feeding: Breast  Dispo: Plan for discharge home tomorrow.  [redacted]w[redacted]d, MD 08/01/2020 9:00 AM

## 2020-08-01 NOTE — Anesthesia Postprocedure Evaluation (Signed)
Anesthesia Post Note  Patient: Leah Price  Procedure(s) Performed: AN AD HOC LABOR EPIDURAL     Patient location during evaluation: Mother Baby Anesthesia Type: Epidural Level of consciousness: awake, awake and alert and oriented Pain management: pain level controlled Vital Signs Assessment: post-procedure vital signs reviewed and stable Respiratory status: spontaneous breathing, nonlabored ventilation and respiratory function stable Cardiovascular status: stable Postop Assessment: no headache, no backache, patient able to bend at knees, no apparent nausea or vomiting, adequate PO intake and able to ambulate Anesthetic complications: no   No complications documented.  Last Vitals:  Vitals:   08/01/20 0430 08/01/20 0841  BP: 105/75 110/74  Pulse: 74 60  Resp: 16 18  Temp:  (!) 36.4 C  SpO2: 99% 100%    Last Pain:  Vitals:   08/01/20 0841  TempSrc: Oral  PainSc: 0-No pain   Pain Goal: Patients Stated Pain Goal: 0 (08/01/20 0100)                 Noelene Gang

## 2020-08-02 LAB — SURGICAL PATHOLOGY

## 2020-08-02 MED ORDER — NORETHINDRONE 0.35 MG PO TABS: 1.0000 | ORAL_TABLET | Freq: Every day | ORAL | 11 refills | Status: DC

## 2020-08-02 MED ORDER — IBUPROFEN 600 MG PO TABS: 600.0000 mg | ORAL_TABLET | Freq: Four times a day (QID) | ORAL | 0 refills | Status: DC

## 2020-08-02 NOTE — Lactation Note (Signed)
This note was copied from a baby's chart. Lactation Consultation Note  Patient Name: Girl Dimitra Boehlke Today's Date: 08/02/2020  Baby girl Tallo now 16 hours old.  Mom was worried she was not getting enough so gave formula.  Mom not pumping yet. Infant given 20 kcal/oz formula.  Did not discuss 22 kcal formula at this time.  Urged mom to add massage and hand expression to pumping. Mom breastfeeding on arrival.  Mom still breastfeeding her [age] year old. Discussed keeping infant in close/chin and cheeks touching breasts.  Infant slides down off the breasts. Referrral sent to [facility] for DEBP.  Mom breastfeeding on arrival.  Showed mom how she could massage and compress to get more milk while breastfeeding.  Did not discuss LPTI guidelines with mom.  Covered back side of baby while breastfeeding.  Urged to feed on cue and 8-12 or more times day.  Urged to call lactation as needed.  Gave [facility] breastfeeding Consultation services handout Maternal Data    Feeding    LATCH Score                   Interventions    Lactation Tools Discussed/Used     Consult Status      Treyshaun Keatts Michaelle Copas 08/02/2020, 12:01 AM

## 2020-08-02 NOTE — Lactation Note (Signed)
This note was copied from a baby's chart. Lactation Consultation Note Baby 35 hrs old. Mom stated baby is wanting to feed a lot.  Newborn behavior and feeding habits discussed. LPI information sheet given and reviewed. Encouraged not to feed long er than 30 minutes at a time. Discussed importance of supplementing after BF Q 2 1/2- 3 hrs. Put baby to the breast when cueing.  Baby BW 5.0lbs discussed w/mom how the baby will probably weigh 4+ lbs this am.  Mom hand expressed colostrum easily expressed. encouraged mom to pump. Mom stated the baby has been BF to much to pump. Mom stated when she has pumped she didn't get anything. Mom hand expressed after pumping into container and spoon fed it to the baby. Praised mom.  Encouraged to go by LPI supplemental sheet according to hours of age.  Mom latched baby. LC gave mom more pillows but mom latched great. encouraged to massage occasionally while feeding.  MBU is out of Similac 22 cal. Only has 20 cal. Similac. Encouraged mom to pump and hand express give baby her EBM as much as possible first before giving formula.  Encouraged mom to call for questions or concerns.  Patient Name: Leah Price [id number] Date: 08/02/2020 Reason for consult: Follow-up assessment;Infant < 6lbs;Early term 37-38.6wks   Maternal Data    Feeding Feeding Type: Breast Fed  LATCH Score Latch: Grasps breast easily, tongue down, lips flanged, rhythmical sucking.  Audible Swallowing: Spontaneous and intermittent  Type of Nipple: Everted at rest and after stimulation  Comfort (Breast/Nipple): Soft / non-tender  Hold (Positioning): No assistance needed to correctly position infant at breast.  LATCH Score: 10  Interventions Interventions: Support pillows;Position options;Breast massage;Breast compression  Lactation Tools Discussed/Used Tools: Pump Breast pump type: Double-Electric Breast Pump   Consult Status Consult Status: Follow-up Date:  08/02/20 (in pm) Follow-up type: In-patient    Leah Price, [name] 08/02/2020, 4:09 AM

## 2020-08-02 NOTE — Lactation Note (Signed)
This note was copied from a baby's chart. Lactation Consultation Note  Patient Name: Leah Price [id number] Date: 08/02/2020 Reason for consult: Follow-up assessment;Early term 37-38.6wks  Follow up visit to 54 hours old with 5.33% weight loss of a P2 mother with breastfeeding experience. Mother states breastfeeding is going well. Mother has not used formula to supplement for the last feedings. Mother reports pumping is well and collecting ~56mL of EBM. Mother explains she is collecting more when hand expressing. Infant is getting EBM after each breastfeeding session. Infant has been been having good voids and stools, per mother. Mother denies any pain or discomfort with latch.   Feeding plan:  1. Breastfeed following hunger cues.  2. Stimulate infant awake at the breast 3. Offer breast 8 -  12 times in 24h period to establish good milk supply.   4. If needed supplement with formula following guidelines, pace bottle feeding and fullness cues.   5. Encouraged maternal rest, hydration and food intake.  6. Contact Lactation Services or local resources for support, questions or concerns.    All questions answered at this time.     Maternal Data Has patient been taught Hand Expression?: Yes  Feeding Feeding Type: Breast Milk  Interventions Interventions: Breast feeding basics reviewed;DEBP;Hand express;Breast massage;Expressed milk  Lactation Tools Discussed/Used Tools: Pump;Bottle Breast pump type: Double-Electric Breast Pump   Consult Status Consult Status: Follow-up Date: 08/03/20 Follow-up type: In-patient    Sivan Quast A Higuera Ancidey 08/02/2020, 11:39 AM

## 2020-08-02 NOTE — Discharge Instructions (Signed)
Oral Contraception Information Oral contraceptive pills (OCPs) are medicines taken to prevent pregnancy. OCPs are taken by mouth, and they work by:  Preventing the ovaries from releasing eggs.  Thickening mucus in the lower part of the uterus (cervix), which prevents sperm from entering the uterus.  Thinning the lining of the uterus (endometrium), which prevents a fertilized egg from attaching to the endometrium. OCPs are highly effective when taken exactly as prescribed. However, OCPs do not prevent STIs (sexually transmitted infections). Safe sex practices, such as using condoms while on an OCP, can help prevent STIs. Before starting OCPs Before you start taking OCPs, you may have a physical exam, blood test, and Pap test. However, you are not required to have a pelvic exam in order to be prescribed OCPs. Your health care provider will make sure you are a good candidate for oral contraception. OCPs are not a good option for certain women, including women who smoke and are older than 35 years, and women with a medical history of high blood pressure, deep vein thrombosis, pulmonary embolism, stroke, cardiovascular disease, or peripheral vascular disease. Discuss with your health care provider the possible side effects of the OCP you may be prescribed. When you start an OCP, be aware that it can take 2-3 months for your body to adjust to changes in hormone levels. Follow instructions from your health care provider about how to start taking your first cycle of OCPs. Depending on when you start the pill, you may need to use a backup form of birth control, such as condoms, during the first week. Make sure you know what steps to take if you ever forget to take the pill. Types of oral contraception  The most common types of birth control pills contain the hormones estrogen and progestin (synthetic progesterone) or progestin only. The combination pill This type of pill contains estrogen and progestin  hormones. Combination pills often come in packs of 21, 28, or 91 pills. For each pack, the last 7 pills may not contain hormones, which means you may stop taking the pills for 7 days. Menstrual bleeding occurs during the week that you do not take the pills or that you take the pills with no hormones in them. The minipill This type of pill contains the progestin hormone only. It comes in packs of 28 pills. All 28 pills contain the hormone. You take the pill every day. It is very important to take the pill at the same time each day. Advantages of oral contraceptive pills  Provides reliable and continuous contraception if taken as instructed.  May treat or decrease symptoms of: ? Menstrual period cramps. ? Irregular menstrual cycle or bleeding. ? Heavy menstrual flow. ? Abnormal uterine bleeding. ? Acne, depending on the type of pill. ? Polycystic ovarian syndrome. ? Endometriosis. ? Iron deficiency anemia. ? Premenstrual symptoms, including premenstrual dysphoric disorder.  May reduce the risk of endometrial and ovarian cancer.  Can be used as emergency contraception.  Prevents mislocated (ectopic) pregnancies and infections of the fallopian tubes. Things that can make oral contraceptive pills less effective OCPs can be less effective if:  You forget to take the pill at the same time every day. This is especially important when taking the minipill.  You have a stomach or intestinal disease that reduces your body's ability to absorb the pill.  You take OCPs with other medicines that make OCPs less effective, such as antibiotics, certain HIV medicines, and some seizure medicines.  You take expired OCPs.    You forget to restart the pill on day 7, if using the packs of 21 pills. Risks associated with oral contraceptive pills Oral contraceptive pills can sometimes cause side effects, such as:  Headache.  Depression.  Trouble sleeping.  Nausea and vomiting.  Breast  tenderness.  Irregular bleeding or spotting during the first several months.  Bloating or fluid retention.  Increase in blood pressure. Combination pills are also associated with a small increase in the risk of:  Blood clots.  Heart attack.  Stroke. Summary  Oral contraceptive pills are medicines taken by mouth to prevent pregnancy. They are highly effective when taken exactly as prescribed.  The most common types of birth control pills contain the hormones estrogen and progestin (synthetic progesterone) or progestin only.  Before you start taking the pill, you may have a physical exam, blood test, and Pap test. Your health care provider will make sure you are a good candidate for oral contraception.  The combination pill may come in a 21-day pack, a 28-day pack, or a 91-day pack. The minipill contains the progesterone hormone only and comes in packs of 28 pills.  Oral contraceptive pills can sometimes cause side effects, such as headache, nausea, breast tenderness, or irregular bleeding. This information is not intended to replace advice given to you by your health care provider. Make sure you discuss any questions you have with your health care provider. Document Revised: 08/02/2017 Document Reviewed: 11/13/2016 Elsevier Patient Education  2020 [employer] Postpartum Care After Vaginal Delivery This sheet gives you information about how to care for yourself from the time you deliver your baby to up to 6-12 weeks after delivery (postpartum period). Your health care provider may also give you more specific instructions. If you have problems or questions, contact your health care provider. Follow these instructions at home: Vaginal bleeding  It is normal to have vaginal bleeding (lochia) after delivery. Wear a sanitary pad for vaginal bleeding and discharge. ? During the first week after delivery, the amount and appearance of lochia is often similar to a menstrual period. ? Over  the next few weeks, it will gradually decrease to a dry, yellow-brown discharge. ? For most women, lochia stops completely by 4-6 weeks after delivery. Vaginal bleeding can vary from woman to woman.  Change your sanitary pads frequently. Watch for any changes in your flow, such as: ? A sudden increase in volume. ? A change in color. ? Large blood clots.  If you pass a blood clot from your vagina, save it and call your health care provider to discuss. Do not flush blood clots down the toilet before talking with your health care provider.  Do not use tampons or douches until your health care provider says this is safe.  If you are not breastfeeding, your period should return 6-8 weeks after delivery. If you are feeding your child breast milk only (exclusive breastfeeding), your period may not return until you stop breastfeeding. Perineal care  Keep the area between the vagina and the anus (perineum) clean and dry as told by your health care provider. Use medicated pads and pain-relieving sprays and creams as directed.  If you had a cut in the perineum (episiotomy) or a tear in the vagina, check the area for signs of infection until you are healed. Check for: ? More redness, swelling, or pain. ? Fluid or blood coming from the cut or tear. ? Warmth. ? Pus or a bad smell.  You may be given a squirt bottle  to use instead of wiping to clean the perineum area after you go to the bathroom. As you start healing, you may use the squirt bottle before wiping yourself. Make sure to wipe gently.  To relieve pain caused by an episiotomy, a tear in the vagina, or swollen veins in the anus (hemorrhoids), try taking a warm sitz bath 2-3 times a day. A sitz bath is a warm water bath that is taken while you are sitting down. The water should only come up to your hips and should cover your buttocks. Breast care  Within the first few days after delivery, your breasts may feel heavy, full, and uncomfortable  (breast engorgement). Milk may also leak from your breasts. Your health care provider can suggest ways to help relieve the discomfort. Breast engorgement should go away within a few days.  If you are breastfeeding: ? Wear a bra that supports your breasts and fits you well. ? Keep your nipples clean and dry. Apply creams and ointments as told by your health care provider. ? You may need to use breast pads to absorb milk that leaks from your breasts. ? You may have uterine contractions every time you breastfeed for up to several weeks after delivery. Uterine contractions help your uterus return to its normal size. ? If you have any problems with breastfeeding, work with your health care provider or Advertising copywriter.  If you are not breastfeeding: ? Avoid touching your breasts a lot. Doing this can make your breasts produce more milk. ? Wear a good-fitting bra and use cold packs to help with swelling. ? Do not squeeze out (express) milk. This causes you to make more milk. Intimacy and sexuality  Ask your health care provider when you can engage in sexual activity. This may depend on: ? Your risk of infection. ? How fast you are healing. ? Your comfort and desire to engage in sexual activity.  You are able to get pregnant after delivery, even if you have not had your period. If desired, talk with your health care provider about methods of birth control (contraception). Medicines  Take over-the-counter and prescription medicines only as told by your health care provider.  If you were prescribed an antibiotic medicine, take it as told by your health care provider. Do not stop taking the antibiotic even if you start to feel better. Activity  Gradually return to your normal activities as told by your health care provider. Ask your health care provider what activities are safe for you.  Rest as much as possible. Try to rest or take a nap while your baby is sleeping. Eating and  drinking   Drink enough fluid to keep your urine pale yellow.  Eat high-fiber foods every day. These may help prevent or relieve constipation. High-fiber foods include: ? Whole grain cereals and breads. ? Brown rice. ? Beans. ? Fresh fruits and vegetables.  Do not try to lose weight quickly by cutting back on calories.  Take your prenatal vitamins until your postpartum checkup or until your health care provider tells you it is okay to stop. Lifestyle  Do not use any products that contain nicotine or tobacco, such as cigarettes and e-cigarettes. If you need help quitting, ask your health care provider.  Do not drink alcohol, especially if you are breastfeeding. General instructions  Keep all follow-up visits for you and your baby as told by your health care provider. Most women visit their health care provider for a postpartum checkup within the first  3-6 weeks after delivery. Contact a health care provider if:  You feel unable to cope with the changes that your child brings to your life, and these feelings do not go away.  You feel unusually sad or worried.  Your breasts become red, painful, or hard.  You have a fever.  You have trouble holding urine or keeping urine from leaking.  You have little or no interest in activities you used to enjoy.  You have not breastfed at all and you have not had a menstrual period for 12 weeks after delivery.  You have stopped breastfeeding and you have not had a menstrual period for 12 weeks after you stopped breastfeeding.  You have questions about caring for yourself or your baby.  You pass a blood clot from your vagina. Get help right away if:  You have chest pain.  You have difficulty breathing.  You have sudden, severe leg pain.  You have severe pain or cramping in your lower abdomen.  You bleed from your vagina so much that you fill more than one sanitary pad in one hour. Bleeding should not be heavier than your heaviest  period.  You develop a severe headache.  You faint.  You have blurred vision or spots in your vision.  You have bad-smelling vaginal discharge.  You have thoughts about hurting yourself or your baby. If you ever feel like you may hurt yourself or others, or have thoughts about taking your own life, get help right away. You can go to the nearest emergency department or call:  Your local emergency services (911 in the [place]  A suicide crisis helpline, such as the National Suicide Prevention Lifeline at (450) 571-5471. This is open 24 hours a day. Summary  The period of time right after you deliver your newborn up to 6-12 weeks after delivery is called the postpartum period.  Gradually return to your normal activities as told by your health care provider.  Keep all follow-up visits for you and your baby as told by your health care provider. This information is not intended to replace advice given to you by your health care provider. Make sure you discuss any questions you have with your health care provider. Document Revised: 08/23/2017 Document Reviewed: 06/03/2017 Elsevier Patient Education  2020 [employer].

## 2020-08-30 ENCOUNTER — Other Ambulatory Visit: Payer: Self-pay

## 2020-08-30 ENCOUNTER — Other Ambulatory Visit (HOSPITAL_COMMUNITY)
Admission: RE | Admit: 2020-08-30 | Discharge: 2020-08-30 | Disposition: A | Discharge status: Home / Self Care | Payer: Medicaid Other | Source: Ambulatory Visit | Attending: Advanced Practice Midwife | Admitting: Advanced Practice Midwife

## 2020-08-30 ENCOUNTER — Ambulatory Visit (INDEPENDENT_AMBULATORY_CARE_PROVIDER_SITE_OTHER): Payer: Medicaid Other | Admitting: Advanced Practice Midwife

## 2020-08-30 ENCOUNTER — Encounter: Payer: Self-pay | Admitting: Advanced Practice Midwife

## 2020-08-30 DIAGNOSIS — Z124 Encounter for screening for malignant neoplasm of cervix: Secondary | ICD-10-CM

## 2020-08-30 NOTE — Progress Notes (Signed)
Post Partum Visit Note  Leah Price is a 19 y.o. [id number] female who presents for a postpartum visit. She is 4 weeks postpartum following a normal spontaneous vaginal delivery.  I have fully reviewed the prenatal and intrapartum course. The delivery was at 37.0 gestational weeks.  Anesthesia: epidural. Postpartum course has been unremarkable. Baby is doing well. Baby is feeding by breast. Bleeding thin lochia. Bowel function is normal. Bladder function is normal. Patient is not sexually active. Contraception method is none. Postpartum depression screening: negative=0.   The pregnancy intention screening data noted above was reviewed. Potential methods of contraception were discussed. The patient elected to proceed with Abstinence.    Edinburgh Postnatal Depression Scale - 08/30/20 1416      Edinburgh Postnatal Depression Scale:  In the Past 7 Days   I have been able to laugh and see the funny side of things. 0    I have looked forward with enjoyment to things. 0    I have blamed myself unnecessarily when things went wrong. 0    I have been anxious or worried for no good reason. 0    I have felt scared or panicky for no good reason. 0    Things have been getting on top of me. 0    I have been so unhappy that I have had difficulty sleeping. 0    I have felt sad or miserable. 0    I have been so unhappy that I have been crying. 0    The thought of harming myself has occurred to me. 0    Edinburgh Postnatal Depression Scale Total 0          The following portions of the patient's history were reviewed and updated as appropriate: allergies, current medications, past family history, past medical history, past social history, past surgical history and problem list.  Review of Systems Pertinent items are noted in HPI.    Objective:  There were no vitals taken for this visit.   General:  alert   Breasts:  deferred  Lungs: clear to auscultation bilaterally  Heart:  regular rate and  rhythm  Abdomen: soft, nontender   Vulva:  normal  Vagina: normal vagina  Cervix:  no cervical motion tenderness, no lesions and small amount of pink mucous, pap smear obtained  Corpus: not examined  Adnexa:  not evaluated  Rectal Exam: deferred        Assessment:   Normal postpartum exam. Pap smear done at today's visit.   Plan:   Essential components of care per ACOG recommendations:  1.  Mood and well being: Patient with negative depression screening today. Reviewed local resources for support.  - Patient does not use tobacco.  - hx of drug use? No    2. Infant care and feeding:  -Patient currently breastmilk feeding? Yes If breastmilk feeding discussed return to work and pumping. If needed, patient was provided letter for work to allow for every 2-3 hr pumping breaks, and to be granted a private location to express breastmilk and refrigerated area to store breastmilk. Reviewed importance of draining breast regularly to support lactation. -Social determinants of health (SDOH) reviewed in EPIC. No concerns  3. Sexuality, contraception and birth spacing - Patient was prescribed POPs prior to discharge. Stopped taking after 2 days d/t headaches. Did not take anything. Discussed side effects of hormonal contraceptive methods and restarting POPs and trying Tylenol/Motrin for headaches prn with follow up in 1 month. Discussed other methods  of contraception with patient. - Patient does not want a pregnancy in the next year. Desired family size is 2 children.  - Reviewed forms of contraception in tiered fashion. Patient desired abstinence today.   - Discussed birth spacing of 18 months - Not sexually active; recommend using condoms or contact office for contraception, if sexually active  4. Sleep and fatigue -Encouraged family/partner/community support of 4 hrs of uninterrupted sleep to help with mood and fatigue  5. Physical Recovery  - Discussed patients delivery and complications -  Patient had no lacerations, perineal healing reviewed. Patient expressed understanding - Patient has urinary incontinence? No  - Patient is safe to resume physical and sexual activity  6.  Health Maintenance - Pap smear collected today     Brand Males, CNM

## 2020-08-31 LAB — CYTOLOGY - PAP: Diagnosis: NEGATIVE

## 2020-09-22 ENCOUNTER — Other Ambulatory Visit: Payer: Medicaid Other

## 2021-04-18 ENCOUNTER — Telehealth: Payer: Medicaid Other | Admitting: Family Medicine

## 2021-04-18 NOTE — Progress Notes (Signed)
Error

## 2021-04-25 ENCOUNTER — Encounter: Payer: Self-pay | Admitting: Family Medicine

## 2021-04-25 ENCOUNTER — Other Ambulatory Visit: Payer: Self-pay

## 2021-04-25 ENCOUNTER — Ambulatory Visit: Payer: Medicaid Other | Admitting: Family Medicine

## 2021-04-25 VITALS — BP 104/74 | HR 84 | Wt 117.0 lb

## 2021-04-25 DIAGNOSIS — N939 Abnormal uterine and vaginal bleeding, unspecified: Secondary | ICD-10-CM

## 2021-04-25 LAB — POCT URINE PREGNANCY: Preg Test, Ur: NEGATIVE

## 2021-04-25 MED ORDER — NORGESTIMATE-ETH ESTRADIOL 0.25-35 MG-MCG PO TABS: 1.0000 | ORAL_TABLET | Freq: Every day | ORAL | 11 refills | Status: DC

## 2021-04-25 NOTE — Progress Notes (Signed)
   GYNECOLOGY OFFICE VISIT NOTE  History:   Leah Price is a 20 y.o. [id number] here today for evaluation of irregular vaginal bleeding. Patient states that she has been getting a period every other week for the last 1.5 months. Her cycle lasts 7 days. It is heavier on the first 5-6 days and ends with spotting on the 7th day. Prior to this, she had regular periods once per month with same duration and amount. She reports taking a pregnancy test at home which has negative. LMP 8/16 and her period ended yesterday. She has not had intercourse since her LMP. She denies dysuria or abnormal vaginal discharge. She reports that with her last pregnancy, she also had irregular bleeding and wants to make sure she is not pregnant. She is interested in finding something that will help regulate her periods better. No personal or FHx of blood clots or clotting disorder. No hx of migraine with aura.    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   Health Maintenance:  Normal pap on 08/30/20.  Review of Systems:  Pertinent items noted in HPI and remainder of comprehensive ROS otherwise negative.  Physical Exam:  BP 104/74   Pulse 84   Wt 117 lb (53.1 kg)   LMP 04/24/2021 (Exact Date)   BMI 19.47 kg/m   CONSTITUTIONAL: Well-developed, well-nourished female in no acute distress.  SKIN: No rash noted, warm and dry.  CARDIOVASCULAR: Normal heart rate noted. RESPIRATORY: Normal work of breathing on room air.  ABDOMEN: Soft, nontender, no masses palpated. PELVIC: Normal appearing external genitalia; normal urethral meatus;  normal appearing vaginal mucosa and cervix.  No abnormal discharge noted.  Normal uterine size, no other palpable masses, no uterine or adnexal tenderness. Performed in the presence of a chaperone. NEUROLOGIC: Alert and oriented to person, place, and time. MUSCULOSKELETAL: Normal range of motion. No edema noted. PSYCHIATRIC: Normal mood and affect. Normal behavior. Normal judgment and thought content.  Labs and Imaging Results for orders placed or performed in visit on 04/25/21 (from the past 168 hour(s))  POCT urine pregnancy   Collection Time: 04/25/21 11:47 AM  Result Value Ref Range   Preg Test, Ur Negative Negative   Assessment and Plan:   1. Abnormal uterine bleeding Irregular menses for 1.5 months. Exam normal. POCT urine pregnancy test negative and no unprotected intercourse since LMP. Recommended trial of combined OCPs to assist with regulation of her menses. Will proceed with Sunday start method and counseled about how to take medication. Will follow up in 3 months to reassess. All questions and concerns addressed.  - POCT urine pregnancy - norgestimate-ethinyl estradiol (ORTHO-CYCLEN) 0.25-35 MG-MCG tablet; Take 1 tablet by mouth daily.  Dispense: 28 tablet; Refill: 11  Return in about 3 months (around 07/26/2021) for follow up of irregular bleeding.    Evalina Field, MD OB Fellow, Faculty [facility], [facility] 04/25/2021 11:51 AM

## 2021-06-26 ENCOUNTER — Emergency Department (HOSPITAL_COMMUNITY): Payer: Medicaid Other

## 2021-06-26 ENCOUNTER — Other Ambulatory Visit: Payer: Self-pay

## 2021-06-26 ENCOUNTER — Emergency Department (HOSPITAL_COMMUNITY)
Admission: EM | Admit: 2021-06-26 | Discharge: 2021-06-26 | Discharge status: Against Medical Advice | Payer: Medicaid Other | Attending: Emergency Medicine | Admitting: Emergency Medicine

## 2021-06-26 DIAGNOSIS — N9489 Other specified conditions associated with female genital organs and menstrual cycle: Secondary | ICD-10-CM | POA: Insufficient documentation

## 2021-06-26 DIAGNOSIS — R112 Nausea with vomiting, unspecified: Secondary | ICD-10-CM | POA: Diagnosis not present

## 2021-06-26 DIAGNOSIS — Z87891 Personal history of nicotine dependence: Secondary | ICD-10-CM | POA: Insufficient documentation

## 2021-06-26 DIAGNOSIS — R1011 Right upper quadrant pain: Secondary | ICD-10-CM | POA: Diagnosis present

## 2021-06-26 DIAGNOSIS — R209 Unspecified disturbances of skin sensation: Secondary | ICD-10-CM | POA: Insufficient documentation

## 2021-06-26 DIAGNOSIS — R0781 Pleurodynia: Secondary | ICD-10-CM | POA: Diagnosis not present

## 2021-06-26 LAB — CBC
HCT: 38.7 % (ref 36.0–46.0)
Hemoglobin: 12.2 g/dL (ref 12.0–15.0)
MCH: 26.7 pg (ref 26.0–34.0)
MCHC: 31.5 g/dL (ref 30.0–36.0)
MCV: 84.7 fL (ref 80.0–100.0)
Platelets: 291 10*3/uL (ref 150–400)
RBC: 4.57 MIL/uL (ref 3.87–5.11)
RDW: 13.3 % (ref 11.5–15.5)
WBC: 7.7 10*3/uL (ref 4.0–10.5)
nRBC: 0 % (ref 0.0–0.2)

## 2021-06-26 LAB — HEPATIC FUNCTION PANEL
ALT: 9 U/L (ref 0–44)
AST: 22 U/L (ref 15–41)
Albumin: 3.3 g/dL — ABNORMAL LOW (ref 3.5–5.0)
Alkaline Phosphatase: 59 U/L (ref 38–126)
Bilirubin, Direct: <0.1 mg/dL (ref 0.0–0.2)
Total Bilirubin: 0.6 mg/dL (ref 0.3–1.2)
Total Protein: 7.1 g/dL (ref 6.5–8.1)

## 2021-06-26 LAB — BASIC METABOLIC PANEL
Anion gap: 8 (ref 5–15)
BUN: 8 mg/dL (ref 6–20)
CO2: 23 mmol/L (ref 22–32)
Calcium: 8.7 mg/dL — ABNORMAL LOW (ref 8.9–10.3)
Chloride: 102 mmol/L (ref 98–111)
Creatinine, Ser: 0.67 mg/dL (ref 0.44–1.00)
GFR, Estimated: >60 mL/min (ref >60)
Glucose, Bld: 87 mg/dL (ref 70–99)
Potassium: 3.6 mmol/L (ref 3.5–5.1)
Sodium: 133 mmol/L — ABNORMAL LOW (ref 135–145)

## 2021-06-26 LAB — TROPONIN I (HIGH SENSITIVITY)
Troponin I (High Sensitivity): <2 ng/L (ref <18)
Troponin I (High Sensitivity): <2 ng/L (ref <18)

## 2021-06-26 LAB — I-STAT BETA HCG BLOOD, ED (MC, WL, AP ONLY): I-stat hCG, quantitative: <5 m[IU]/mL (ref <5)

## 2021-06-26 LAB — D-DIMER, QUANTITATIVE: D-Dimer, Quant: 0.61 ug/mL-FEU — ABNORMAL HIGH (ref 0.00–0.50)

## 2021-06-26 LAB — LIPASE, BLOOD: Lipase: 32 U/L (ref 11–51)

## 2021-06-26 NOTE — ED Notes (Signed)
NT informed this RN that the patient was seen walking out of ED. Pt not in room. Dr. Rush Landmark informed the patient left AMA.

## 2021-06-26 NOTE — ED Provider Notes (Signed)
Emergency Medicine Provider Triage Evaluation Note  Leah Price , a 20 y.o. female  was evaluated in triage.  Pt complains of N/V, chest pain, abdominal cramping tingling in the right arm x 4 weeks since starting birth control pills.  States that she cannot ambulate and her the end of next month.  She has never been on birth control prior.  Review of Systems  Positive: Nausea, vomiting x4 NBNB emesis daily, abdominal cramping, chest pain, palpitations Negative: Vaginal bleeding or discharge, shortness of breath  Physical Exam  BP 118/80 (BP Location: Right Arm)   Pulse 85   Temp 98.6 F (37 C)   Resp 18   SpO2 100%  Gen:   Awake, no distress   Resp:  Normal effort  MSK:   Moves extremities without difficulty  Other:  Abdomen generally very mildly tender to palpation.  RRR respiratory history.  Lung CTA B.  Medical Decision Making  Medically screening exam initiated at 12:39 PM.  Appropriate orders placed.  Leah Price was informed that the remainder of the evaluation will be completed by another provider, this initial triage assessment does not replace that evaluation, and the importance of remaining in the ED until their evaluation is complete.  This chart was dictated using voice recognition software, Dragon. Despite the best efforts of this provider to proofread and correct errors, errors may still occur which can change documentation meaning.   Paris Lore, PA-C 06/26/21 1241    Mancel Bale, MD 06/26/21 1806

## 2021-06-26 NOTE — ED Triage Notes (Signed)
Pt reports since starting birth control pills one month ago she has had daily intermittent chest pain, abdominal cramps, vomiting, and R arm numbness.

## 2021-06-26 NOTE — ED Provider Notes (Signed)
[facility] EMERGENCY DEPARTMENT Provider Note   CSN: [id number] Arrival date & time: 06/26/21  1156     History Chief Complaint  Patient presents with   Abdominal Pain   Emesis    Leah Price is a 20 y.o. female.  The history is provided by the patient and medical records. No language interpreter was used.  Abdominal Pain Pain location:  Epigastric and RUQ Pain quality: aching and sharp   Pain radiates to:  Chest, epigastric region and RUQ Pain severity:  Moderate Onset quality:  Gradual Duration:  4 weeks Timing:  Intermittent Progression:  Waxing and waning Chronicity:  New Relieved by:  Nothing Worsened by:  Nothing Ineffective treatments:  None tried Associated symptoms: nausea and vomiting   Associated symptoms: no chest pain, no chills, no constipation, no cough, no diarrhea, no dysuria, no fatigue, no fever, no shortness of breath, no vaginal bleeding and no vaginal discharge   Emesis Associated symptoms: abdominal pain   Associated symptoms: no chills, no cough, no diarrhea, no fever and no headaches       Past Medical History:  Diagnosis Date   Alpha thalassemia silent carrier 02/19/2020   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    Patient Active Problem List   Diagnosis Date Noted   Pregnancy affected by fetal growth restriction 07/20/2020   Preterm labor in third trimester without delivery 06/22/2020   Rubella non-immune status, antepartum 05/04/2020   Alpha thalassemia silent carrier 02/19/2020   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   Supervision of other normal pregnancy, antepartum 02/04/2020   History of prior pregnancy with IUGR newborn 08/15/2018    Past Surgical History:  Procedure Laterality Date   NO PAST SURGERIES       OB History     Gravida  2   Para  2   Term  2   Preterm      AB      Living  2      SAB      IAB      Ectopic      Multiple  0   Live Births  2           Family  History  Problem Relation Age of Onset   Miscarriages / Stillbirths Mother    Hyperlipidemia Maternal Grandmother    ADD / ADHD Neg Hx    Alcohol abuse Neg Hx    Anxiety disorder Neg Hx    Arthritis Neg Hx    Asthma Neg Hx    Birth defects Neg Hx    Cancer Neg Hx    COPD Neg Hx    Depression Neg Hx    Diabetes Neg Hx    Drug abuse Neg Hx    Hearing loss Neg Hx    Heart disease Neg Hx    Hypertension Neg Hx    Intellectual disability Neg Hx    Kidney disease Neg Hx    Learning disabilities Neg Hx    Obesity Neg Hx    Stroke Neg Hx    Vision loss Neg Hx    Varicose Veins Neg Hx     Social History   Tobacco Use   Smoking status: Former   Smokeless tobacco: Never  Building services engineer Use: Never used  Substance Use Topics   Alcohol use: Not Currently   Drug use: Never    Home Medications Prior to Admission medications   Medication Sig Start Date End Date  Taking? Authorizing Provider  norgestimate-ethinyl estradiol (ORTHO-CYCLEN) 0.25-35 MG-MCG tablet Take 1 tablet by mouth daily. 04/25/21   Worthy Rancher, MD    Allergies    Patient has no known allergies.  Review of Systems   Review of Systems  Constitutional:  Negative for chills, fatigue and fever.  HENT:  Negative for congestion.   Respiratory:  Negative for cough, chest tightness, shortness of breath and wheezing.   Cardiovascular:  Negative for chest pain, palpitations and leg swelling.  Gastrointestinal:  Positive for abdominal pain, nausea and vomiting. Negative for abdominal distention, constipation and diarrhea.  Genitourinary:  Negative for dysuria, flank pain, frequency, vaginal bleeding and vaginal discharge.  Musculoskeletal:  Negative for back pain, neck pain and neck stiffness.  Skin:  Negative for rash and wound.  Neurological:  Negative for light-headedness and headaches.  Psychiatric/Behavioral:  Negative for agitation.   All other systems reviewed and are negative.  Physical  Exam Updated Vital Signs BP 104/75 (BP Location: Left Arm)   Pulse 77   Temp 98.6 F (37 C)   Resp 16   LMP 06/07/2021   SpO2 100%   Physical Exam Vitals and nursing note reviewed.  Constitutional:      General: She is not in acute distress.    Appearance: She is well-developed. She is not ill-appearing, toxic-appearing or diaphoretic.  HENT:     Head: Normocephalic and atraumatic.  Eyes:     Conjunctiva/sclera: Conjunctivae normal.  Cardiovascular:     Rate and Rhythm: Normal rate and regular rhythm.     Heart sounds: No murmur heard. Pulmonary:     Effort: Pulmonary effort is normal. No respiratory distress.     Breath sounds: Normal breath sounds. No wheezing, rhonchi or rales.  Chest:     Chest wall: Tenderness (lower) present.  Abdominal:     General: Abdomen is flat. Bowel sounds are normal. There is no distension.     Palpations: Abdomen is soft.     Tenderness: There is abdominal tenderness in the right upper quadrant and epigastric area. There is no right CVA tenderness, left CVA tenderness, guarding or rebound.  Musculoskeletal:     Cervical back: Neck supple.  Skin:    General: Skin is warm and dry.     Capillary Refill: Capillary refill takes less than 2 seconds.  Neurological:     General: No focal deficit present.     Mental Status: She is alert.  Psychiatric:        Mood and Affect: Mood normal.    ED Results / Procedures / Treatments   Labs (all labs ordered are listed, but only abnormal results are displayed) Labs Reviewed  BASIC METABOLIC PANEL - Abnormal; Notable for the following components:      Result Value   Sodium 133 (*)    Calcium 8.7 (*)    All other components within normal limits  CBC  LIPASE, BLOOD  HEPATIC FUNCTION PANEL  URINALYSIS, ROUTINE W REFLEX MICROSCOPIC  D-DIMER, QUANTITATIVE  I-STAT BETA HCG BLOOD, ED ([facility], [facility], [facility] ONLY)  TROPONIN I (HIGH SENSITIVITY)  TROPONIN I (HIGH SENSITIVITY)    EKG EKG  Interpretation  Date/Time:  Monday June 26 2021 12:29:49 EDT Ventricular Rate:  89 PR Interval:  126 QRS Duration: 76 QT Interval:  350 QTC Calculation: 425 R Axis:   93 Text Interpretation: Normal sinus rhythm Rightward axis Borderline ECG No prior ECG for  comparison. No STEMI Confirmed by Theda Belfast ([id number]) on 06/26/2021 4:06:54  PM  Radiology DG Chest 2 View  Result Date: 06/26/2021 CLINICAL DATA:  Chest pain EXAM: CHEST - 2 VIEW COMPARISON:  03/07/2009 FINDINGS: The heart size and mediastinal contours are within normal limits. Both lungs are clear. The visualized skeletal structures are unremarkable. IMPRESSION: No active cardiopulmonary disease. Electronically Signed   By: Signa Kell M.D.   On: 06/26/2021 13:35   US Abdomen Limited RUQ (LIVER/GB)  Result Date: 06/26/2021 CLINICAL DATA:  Initial evaluation for acute right upper quadrant pain, nausea, vomiting. EXAM: ULTRASOUND ABDOMEN LIMITED RIGHT UPPER QUADRANT COMPARISON:  None available. FINDINGS: Gallbladder: No gallstones or wall thickening visualized. No sonographic Murphy sign noted by sonographer. Common bile duct: Diameter: 2.1 mm Liver: No focal lesion identified. Within normal limits in parenchymal echogenicity. Portal vein is patent on color Doppler imaging with normal direction of blood flow towards the liver. Other: None. IMPRESSION: Normal right upper quadrant ultrasound. Electronically Signed   By: Rise Mu M.D.   On: 06/26/2021 19:19    Procedures Procedures   Medications Ordered in ED Medications - No data to display  ED Course  I have reviewed the triage vital signs and the nursing notes.  Pertinent labs & imaging results that were available during my care of the patient were reviewed by me and considered in my medical decision making (see chart for details).    MDM Rules/Calculators/A&P                           Eloyce Klahr is a 20 y.o. female with no significant past medical  history who presents with several complaints including waxing and waning chest pain, abdominal cramping, nausea, vomiting, and some right arm tingling that comes and goes.  Patient reports that about a month ago she started on birth control.  She says that about a week after taking it she has been having the symptoms.  She reports she occasionally gets shortness of breath when the discomfort is worse and her discomfort in her chest is a sharp discomfort across her chest that is worse with deep breathing.  It is not worse with exertion.  She denies any trauma or rashes.  She also describes the discomfort being in her epigastrium right upper quadrant primarily but occasionally has diffuse discomfort.  She reports some nausea and vomiting but denies any constipation, diarrhea, or urinary changes.  Denies any vaginal complaints.  She reports her left arm occasionally gets tingling when she is having the chest discomfort but denies any vision changes, speech difficulties, weakness, headache, and does not report any dizziness or difficulty with ambulation.  Patient reports that her symptoms have been persistent and worsened prompting her to seek evaluation.  On exam, lungs are clear.  Chest was nontender for me.  Abdomen was slightly tender in the right upper quadrant and epigastric area but not in the lower abdomen on my exam.  Normal bowel sounds.  Flanks and back nontender.  No rashes.  No focal neurologic deficits on my exam.  Had a shared decision made conversation with patient.  I suspect that her symptoms are related to her new birth control medication that is causing this constellation of complaints however as she is now on estrogen-based birth control, is reasonable to rule out a PE with a D-dimer.  Due to the tenderness in her epigastric area and right upper quadrant we will get an ultrasound and some labs to assess for liver function.  Her right lower  quadrant was not tender for me so we will hold on  appendicitis work-up initially.  If work-up is totally reassuring, anticipate have her being discharged to follow-up with her OB/GYN/PCP and discussed with him discontinuation of her medication.  Anticipate discharge after work-up is completed  7:38 PM Patient's ultrasound returned and was reassuring however after her labs were collected including the hepatic function and D-dimer, patient said she had to leave to pick up a child and eloped before work-up was completed.  Patient eloped and what appear to be stable condition with stable vital signs.  If D-dimer is elevated, will call her back but otherwise we will allow her to follow-up with her PCP for her other results.   Final Clinical Impression(s) / ED Diagnoses Final diagnoses:  RUQ abdominal pain  Pleuritic chest pain      Clinical Impression: 1. Pleuritic chest pain   2. RUQ abdominal pain     Disposition: Eloped  Condition: Stable   New Prescriptions   No medications on file    Follow Up: No follow-up provider specified.    Jashad Depaula, Canary Brim, MD 06/26/21 [phone]

## 2021-07-26 ENCOUNTER — Ambulatory Visit: Payer: Medicaid Other | Admitting: Obstetrics and Gynecology

## 2021-10-12 IMAGING — US US MFM OB DETAIL+14 WK
1 series · 13 of 28 positions shown · non-contrast
Comparison: none

[Series 1: us mfm ob detail+14 wk · 13 of 95 slices shown]
[im 4/95]
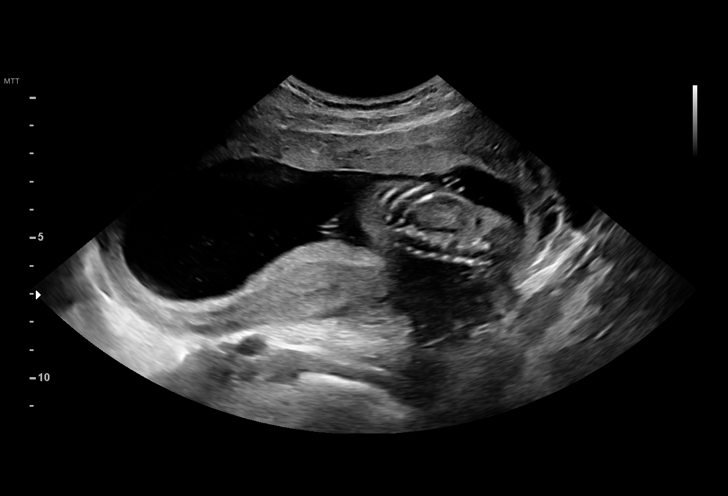
[im 11/95]
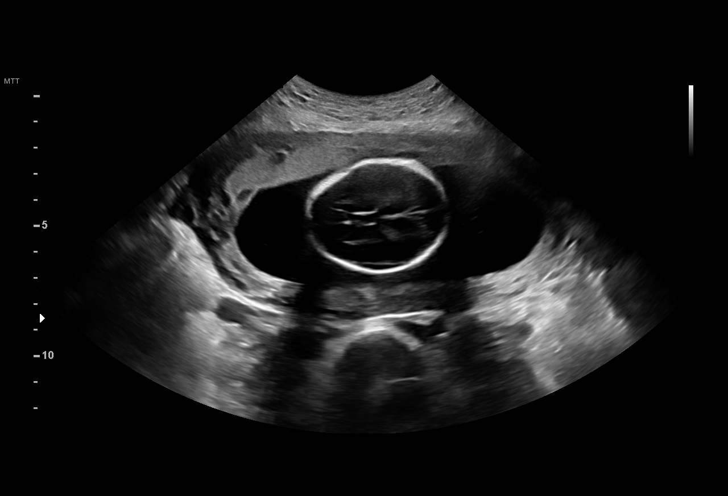
[im 18/95]
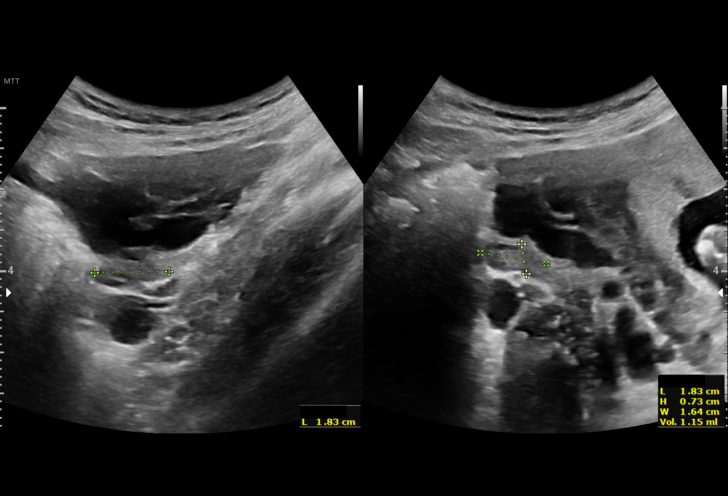
[im 25/95]
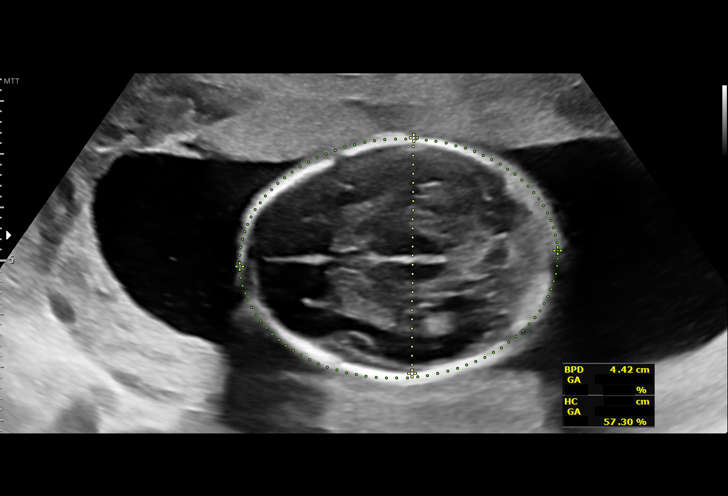
[im 32/95]
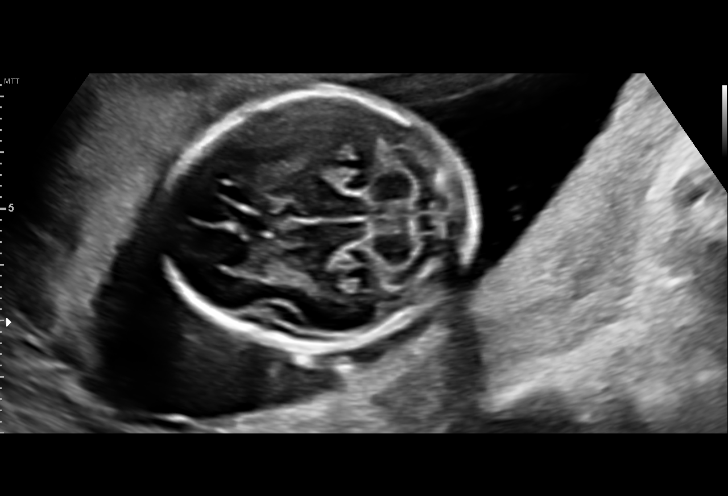
[im 39/95]
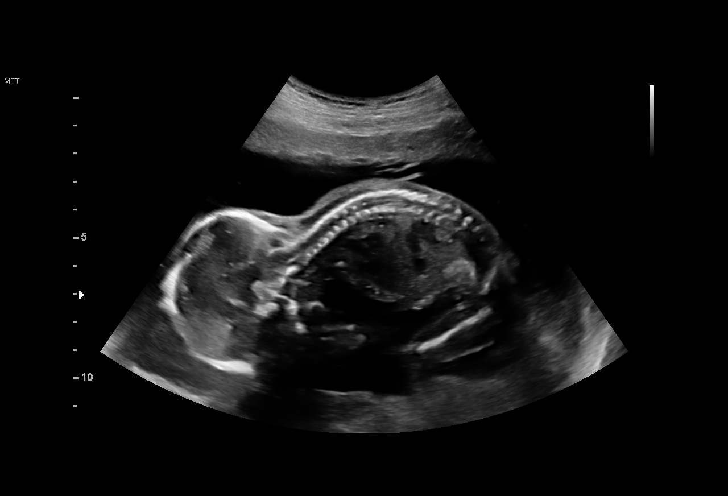
[im 49/95]
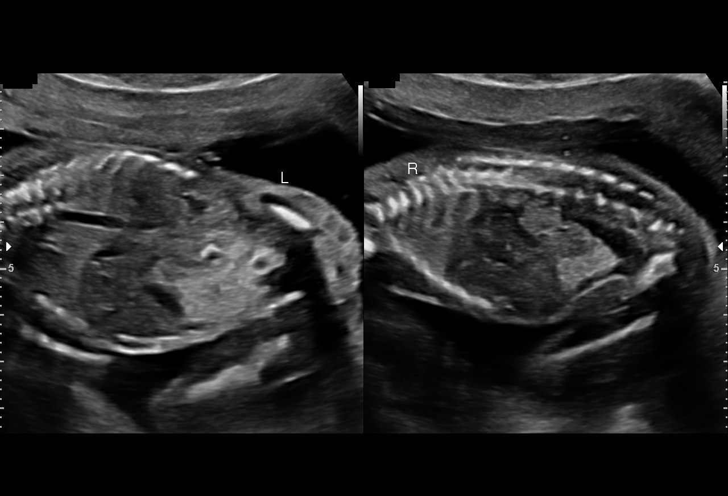
[im 56/95]
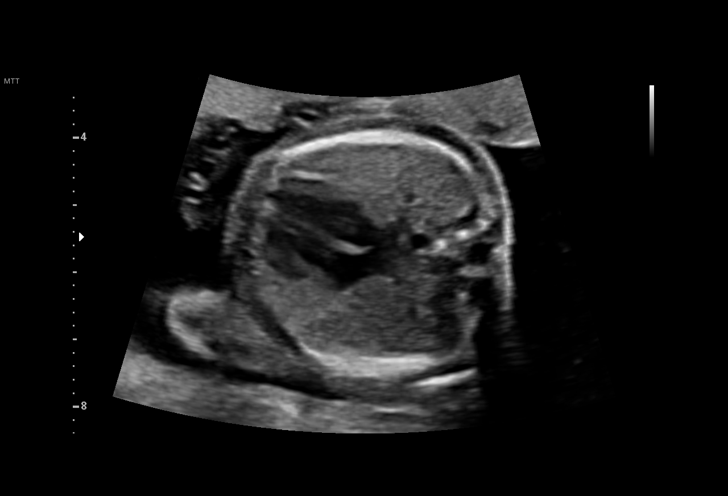
[im 63/95]
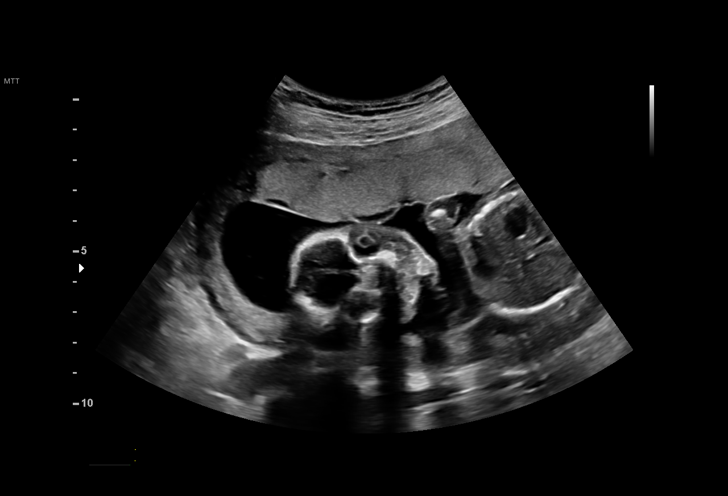
[im 70/95]
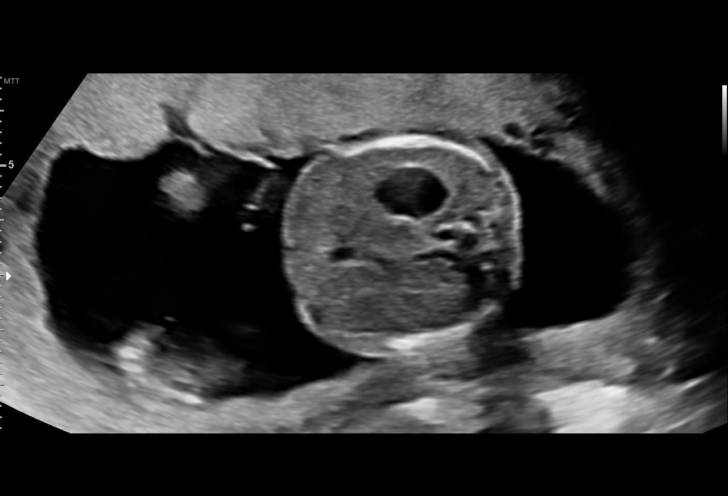
[im 77/95]
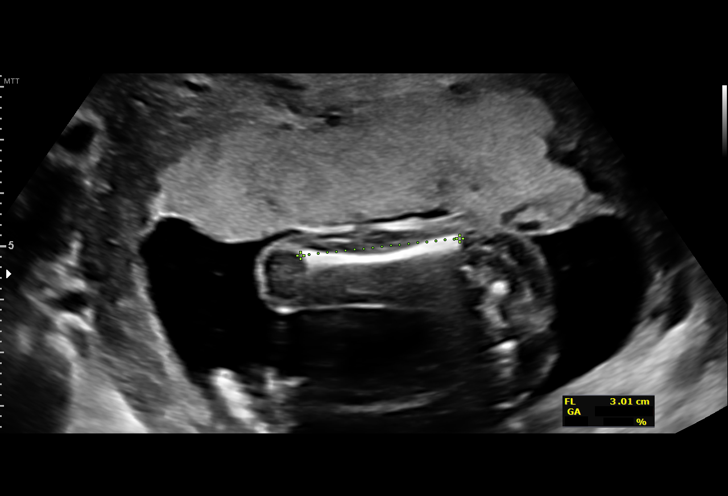
[im 84/95]
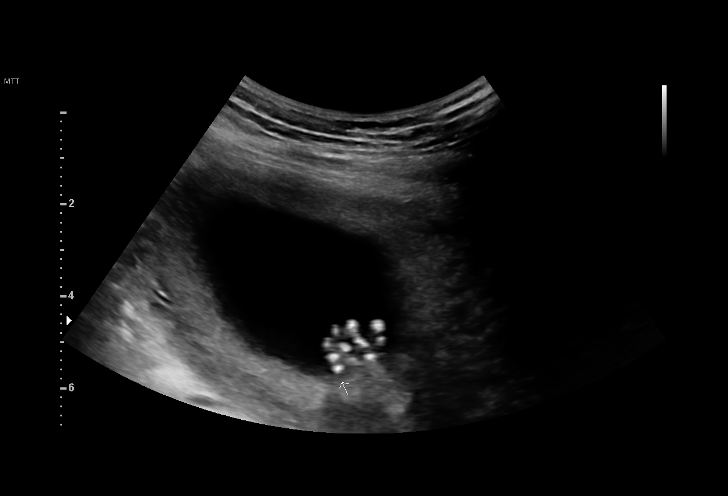
[im 91/95]
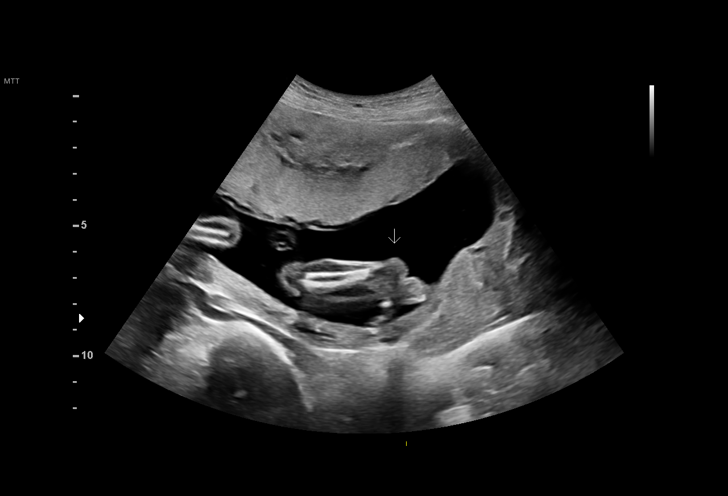

[13 of 28 positions shown; findings below may reference images not displayed]

Indications

 19 weeks gestation of pregnancy
 Genetic carrier (Hgb Wiro Bansel, Jei Pil)
 Encounter for antenatal screening for
 malformations
 Poor obstetric history: Previous fetal growth
 restriction (FGR) Induced at 37 weeks, Child
 4 lbs
Fetal Evaluation

 Num Of Fetuses:         1
 Fetal Heart Rate(bpm):  158
 Cardiac Activity:       Observed
 Presentation:           Breech
 Placenta:               Anterior
 P. Cord Insertion:      Visualized, central

 Amniotic Fluid
 AFI FV:      Within normal limits

                             Largest Pocket(cm)

Biometry

 BPD:      43.8  mm     G. Age:  19w 2d         55  %    CI:        71.83   %    70 - 86
                                                         FL/HC:      17.9   %    16.1 -
 HC:      164.5  mm     G. Age:  19w 1d         44  %    HC/AC:      1.18        1.09 -
 AC:      138.9  mm     G. Age:  19w 2d         51  %    FL/BPD:     67.1   %
 FL:       29.4  mm     G. Age:  19w 0d         40  %    FL/AC:      21.2   %    20 - 24
 HUM:      29.2  mm     G. Age:  19w 4d         60  %
 CER:      19.1  mm     G. Age:  18w 5d         21  %
 NFT:       3.5  mm
 LV:        6.3  mm
 CM:        5.9  mm

 Est. FW:     279  gm    0 lb 10 oz      48  %
OB History

 Blood Type:   B+
 Gravidity:    2         Term:   1        Prem:   0        SAB:   0
 TOP:          0       Ectopic:  0        Living: 1
Gestational Age

 LMP:           19w 1d        Date:  11/15/19                 EDD:   08/21/20
 U/S Today:     19w 1d                                        EDD:   08/21/20
 Best:          19w 1d     Det. By:  LMP  (11/15/19)          EDD:   08/21/20
Anatomy

 Cranium:               Appears normal         Aortic Arch:            Appears normal
 Cavum:                 Appears normal         Ductal Arch:            Not well visualized
 Ventricles:            Appears normal         Diaphragm:              Appears normal
 Choroid Plexus:        Appears normal         Stomach:                Appears normal, left
                                                                       sided
 Cerebellum:            Appears normal         Abdomen:                Appears normal
 Posterior Fossa:       Appears normal         Abdominal Wall:         Appears nml (cord
                                                                       insert, abd wall)
 Nuchal Fold:           Appears normal         Cord Vessels:           Appears normal (3
                                                                       vessel cord)
 Face:                  Orbits nl; profile not Kidneys:                Appear normal
                        well visualized
 Lips:                  Appears normal         Bladder:                Appears normal
 Thoracic:              Appears normal         Spine:                  Appears normal
 Heart:                 Appears normal         Upper Extremities:      Visualized
                        (4CH, axis, and
                        situs)
 RVOT:                  Not well visualized    Lower Extremities:      Visualized
 LVOT:                  Not well visualized

 Other:  Fetus appears to be female. Right 5th digit visualized. Left Heel
         appeasr normal. Technically difficult due to fetal position.
Cervix Uterus Adnexa

 Cervix
 Length:           3.18  cm.
 Normal appearance by transabdominal scan.

 Uterus
 No abnormality visualized.

 Right Ovary
 Within normal limits. No adnexal mass visualized.
 Left Ovary
 Within normal limits. No adnexal mass visualized.

 Cul De Sac
 No free fluid seen.

 Adnexa
 No abnormality visualized.
Impression

 G2 P1.
 We performed fetal anatomy scan. No makers of
 aneuploidies or fetal structural defects are seen. Fetal
 biometry is consistent with her previously-established dates.
 Amniotic fluid is normal and good fetal activity is seen.
 Patient understands the limitations of ultrasound in detecting
 fetal anomalies.

 On cell-free fetal DNA screening, the risks of fetal
 aneuploidies are not increased .  She is a silent carrier for
 alpha thalassemia (aa/a-) and a carrier of hemoglobin E.
 Patient opted not to meet with our genetic counselor.

 Obstetric history significant for a term vaginal delivery.  Her
 pregnancy was complicated by fetal growth restriction.  Her
 son is in good health now.

 We performed fetal anatomy scan. No makers of
 aneuploidies or fetal structural defects are seen. Fetal
 biometry is consistent with her previously-established dates.
 Amniotic fluid is normal and good fetal activity is seen.
 Patient understands the limitations of ultrasound in detecting
 fetal anomalies.
Recommendations

 -An appointment was made for her to return in 4 weeks for
 completion of fetal anatomy.
 -Fetal growth assessment at 32 weeks.
                 Majano, Susmitha

## 2021-11-09 IMAGING — US US MFM OB FOLLOW-UP
1 series · 14 of 28 positions shown · non-contrast
Comparison: none

[Series 1: us mfm ob follow-up · 57 acquisitions, 14 frames shown]
[im 3/57]
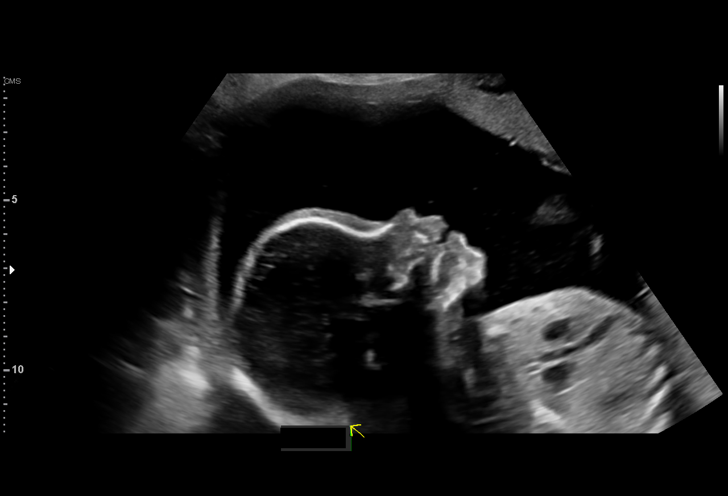
[im 7/57]
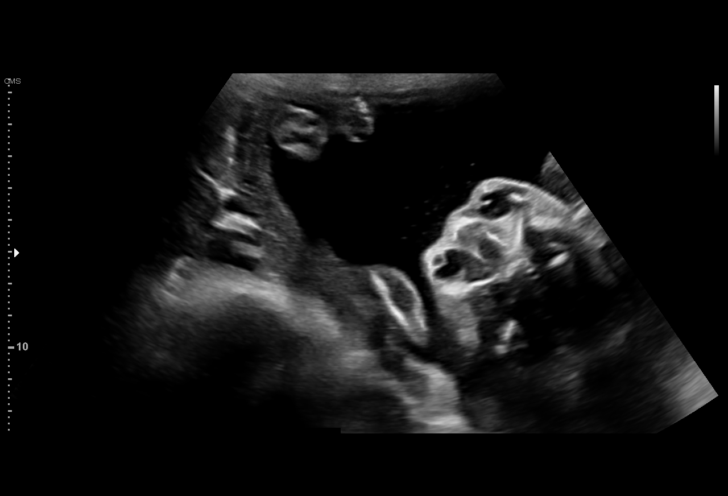
[im 11/57]
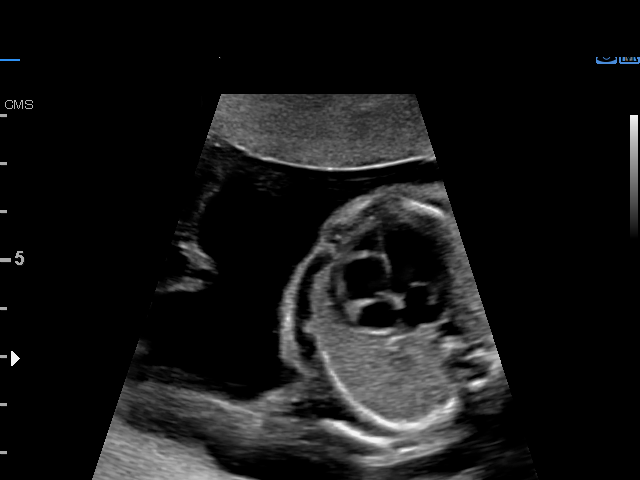
[im 15/57]
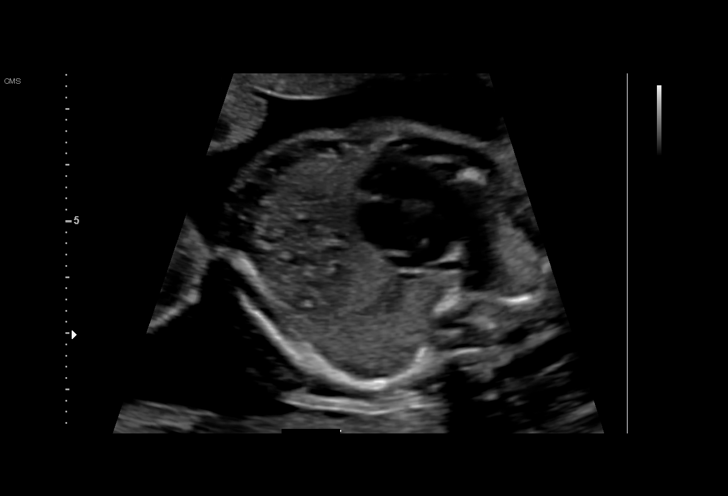
[im 19/57]
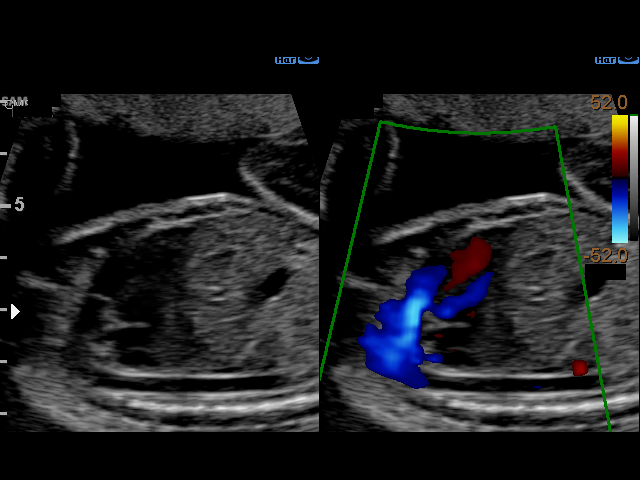
[im 23/57]
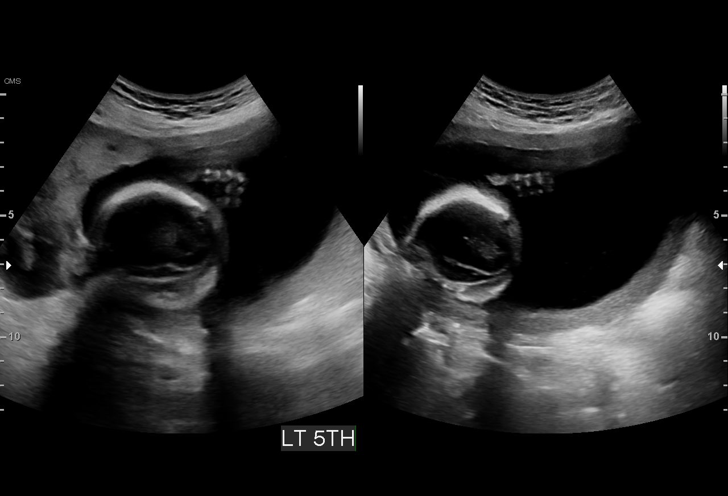
[im 27/57]
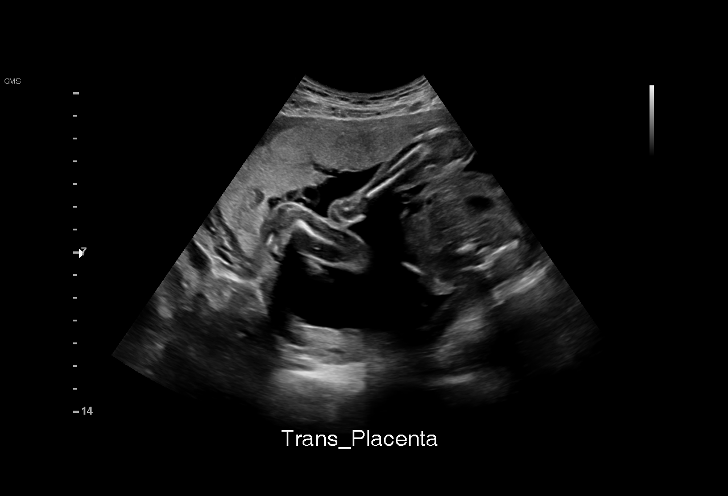
[im 32/57]
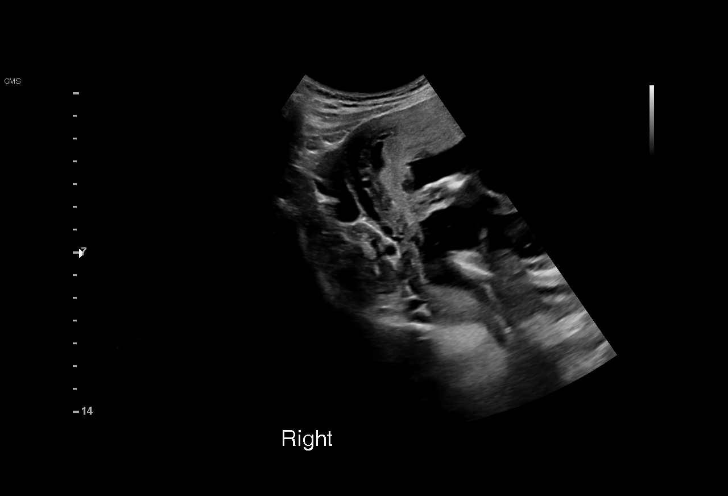
[im 36/57]
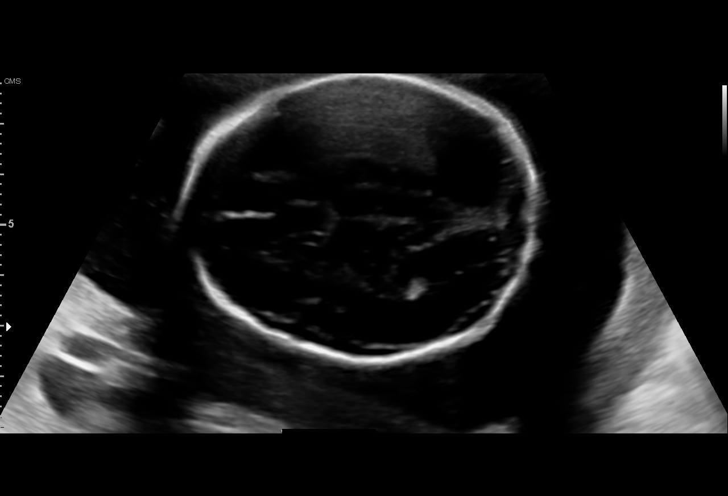
[im 40/57]
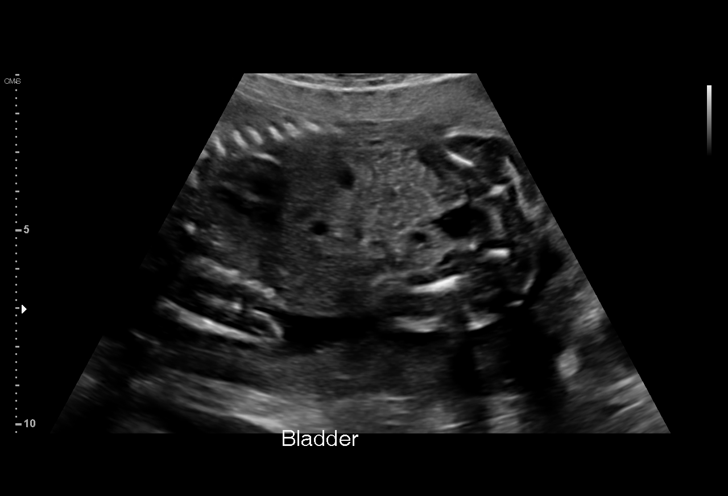
[im 44/57]
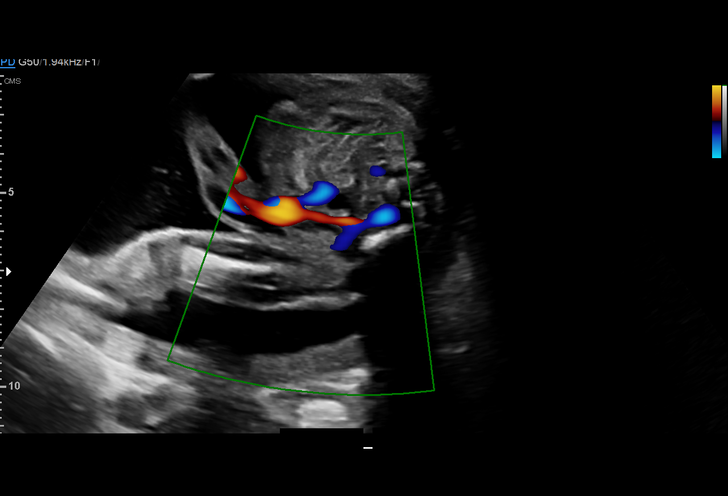
[im 48/57]
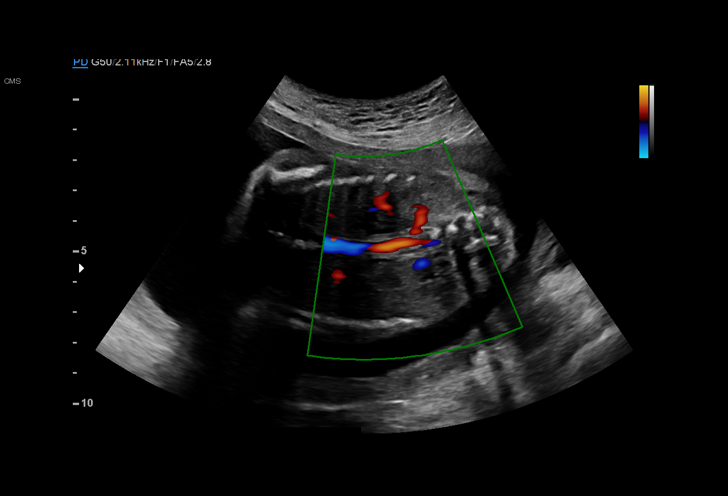
[im 52/57]
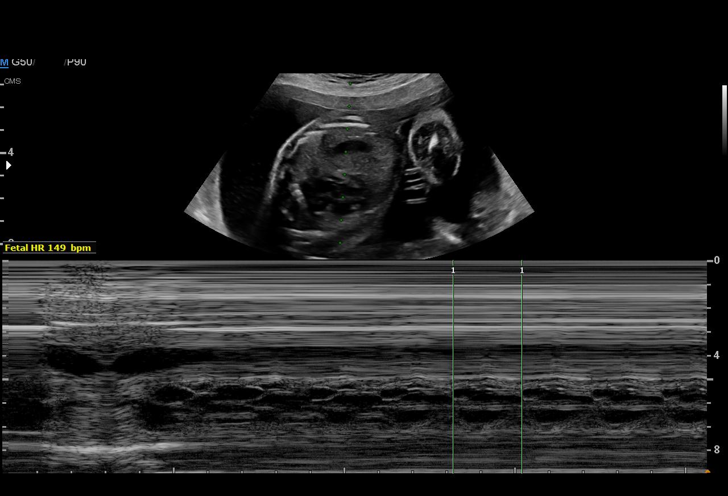
[im 57/57]
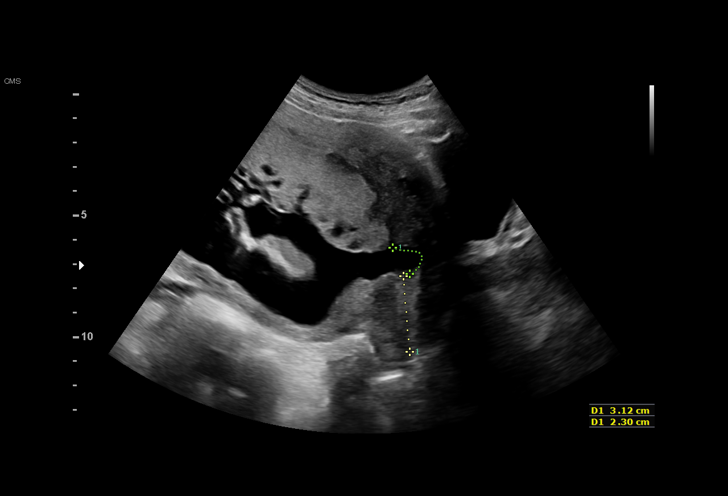

[14 of 28 positions shown; findings below may reference images not displayed]

Indications

 23 weeks gestation of pregnancy
 Genetic carrier (Hgb Altenhofen, Fabriciana)
 Poor obstetric history: Previous fetal growth
 restriction (FGR) Induced at 37 weeks, Child
 4 lbs
 Antenatal follow-up for nonvisualized fetal
 anatomy
Fetal Evaluation

 Num Of Fetuses:         1
 Fetal Heart Rate(bpm):  149
 Cardiac Activity:       Observed
 Presentation:           Breech
 Placenta:               Anterior
 P. Cord Insertion:      Visualized

 Amniotic Fluid
 AFI FV:      Within normal limits

                             Largest Pocket(cm)

Biometry

 BPD:      56.7  mm     G. Age:  23w 2d         52  %    CI:        74.38   %    70 - 86
                                                         FL/HC:      19.3   %    19.2 -
 HC:      208.7  mm     G. Age:  23w 0d         28  %    HC/AC:      1.15        1.05 -
 AC:      180.9  mm     G. Age:  23w 0d         35  %    FL/BPD:     70.9   %    71 - 87
 FL:       40.2  mm     G. Age:  23w 0d         33  %    FL/AC:      22.2   %    20 - 24
 HUM:        38  mm     G. Age:  23w 3d         47  %

 Est. FW:     551  gm      1 lb 3 oz     34  %
OB History

 Blood Type:   B+
 Gravidity:    2         Term:   1        Prem:   0        SAB:   0
 TOP:          0       Ectopic:  0        Living: 1
Gestational Age

 LMP:           23w 1d        Date:  11/15/19                 EDD:   08/21/20
 U/S Today:     23w 1d                                        EDD:   08/21/20
 Best:          23w 1d     Det. By:  LMP  (11/15/19)          EDD:   08/21/20
Anatomy

 Cranium:               Appears normal         Aortic Arch:            Appears normal
 Cavum:                 Appears normal         Ductal Arch:            Appears normal
 Ventricles:            Appears normal         Diaphragm:              Appears normal
 Choroid Plexus:        Appears normal         Stomach:                Appears normal, left
                                                                       sided
 Cerebellum:            Appears normal         Abdomen:                Appears normal
 Posterior Fossa:       Appears normal         Abdominal Wall:         Appears nml (cord
                                                                       insert, abd wall)
 Nuchal Fold:           Appears normal         Cord Vessels:           Appears normal (3
                                                                       vessel cord)
 Face:                  Orbits nl; profile not Kidneys:                Appear normal
                        well visualized
 Lips:                  Appears normal         Bladder:                Appears normal
 Thoracic:              Appears normal         Spine:                  Previously seen
 Heart:                 Appears normal         Upper Extremities:      Previously seen
                        (4CH, axis, and
                        situs)
 RVOT:                  Appears normal         Lower Extremities:      Previously seen
 LVOT:                  Appears normal

 Other:  Heels visualized. Left 5th digit visualized. Female gender previously
         seen. Right 5th digit previously visualized. Technically difficult due to
         fetal position.
Cervix Uterus Adnexa

 Cervix
 Normal appearance by transabdominal scan.

 Uterus
 No abnormality visualized.

 Right Ovary
 Not visualized.

 Left Ovary
 Not visualized.
 Cul De Sac
 No free fluid seen.

 Adnexa
 No abnormality visualized.
Impression

 Patient returned for completion of fetal anatomy .Fetal growth
 is appropriate for gestational age .Amniotic fluid is normal
 and good fetal activity is seen .Fetal anatomical survey was
 completed and appears normal.

 History of fetal growth restriction. Recommend follow-up fetal
 growth assessment at 32 weeks.
Recommendations

 Fetal growth at 32 weeks.
                 Yoi, Dayat Melarat

## 2022-02-01 IMAGING — US US MFM FETAL BPP W/O NON-STRESS
1 series · 13 of 28 positions shown · non-contrast
Comparison: none

[Series 1: us mfm fetal bpp w/o non-stress · 56 acquisitions, 13 frames shown]
[im 3/56]
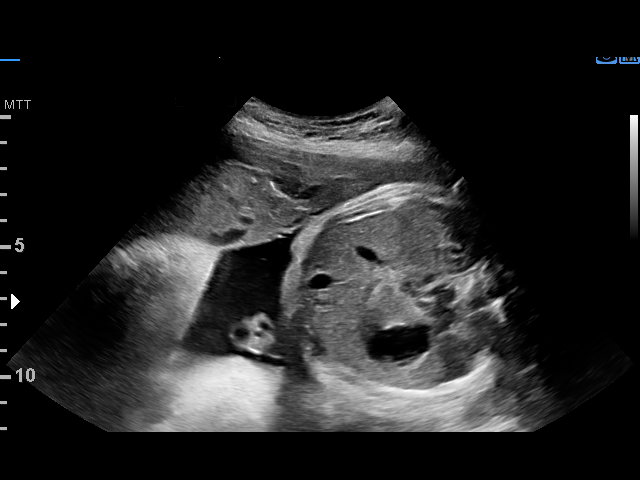
[im 7/56]
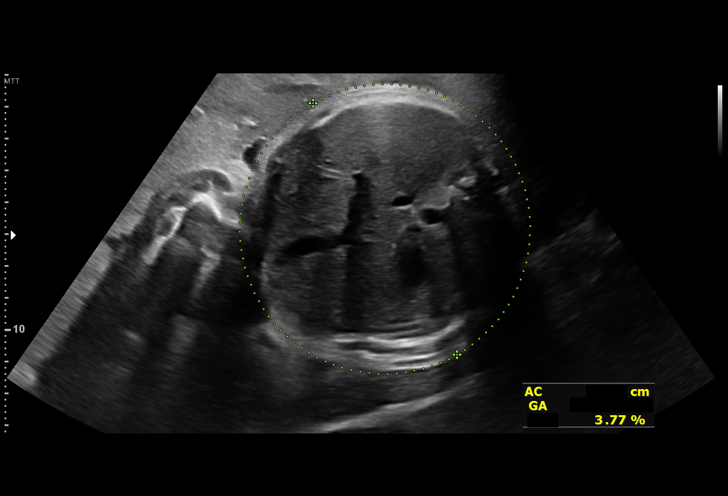
[im 11/56]
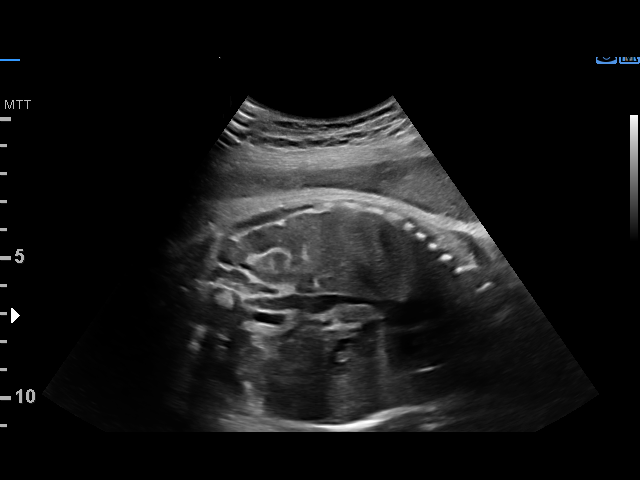
[im 15/56]
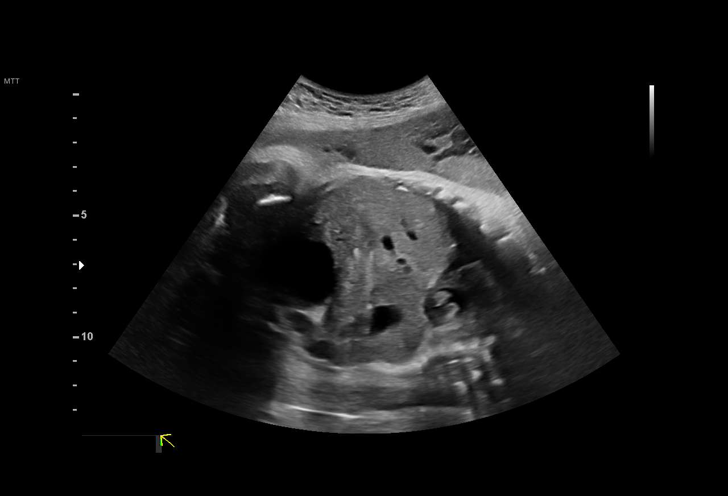
[im 19/56]
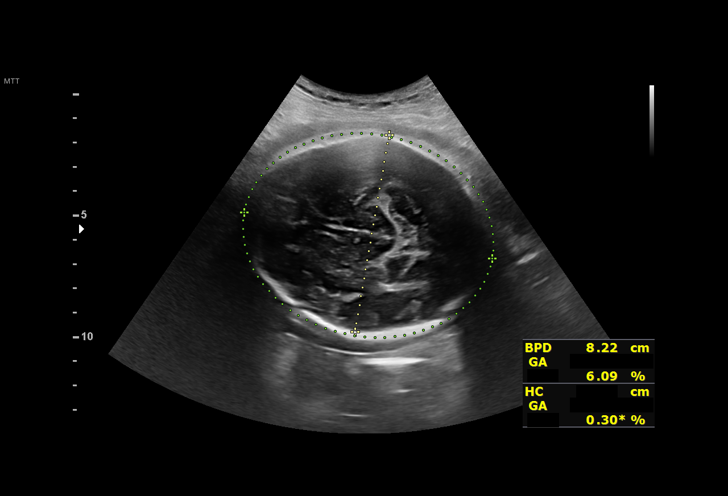
[im 23/56]
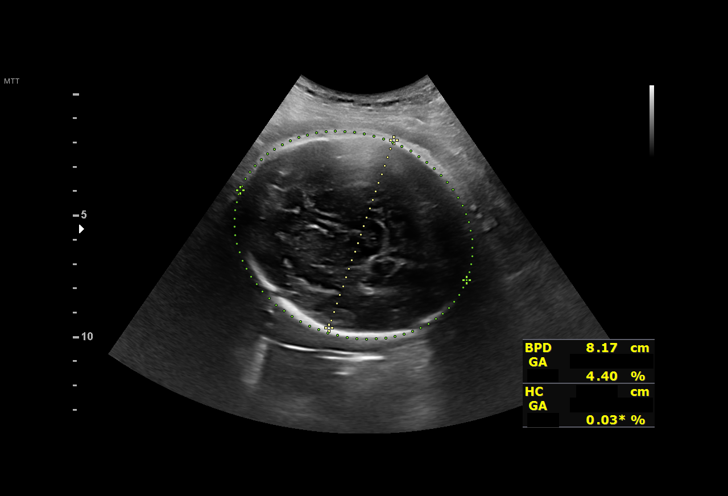
[im 29/56]
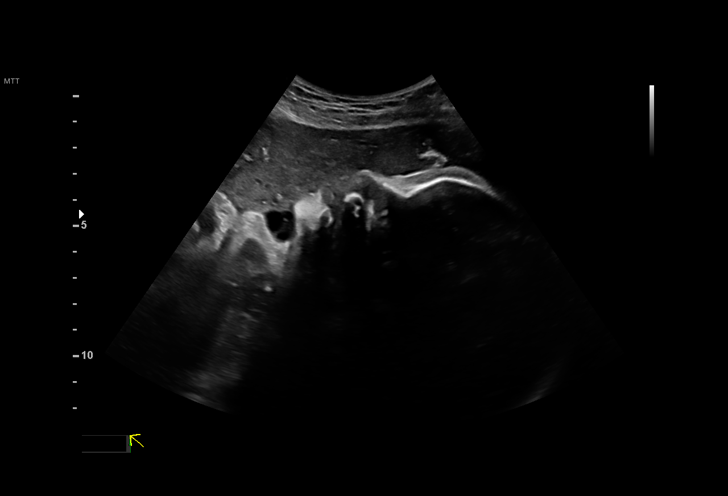
[im 33/56]
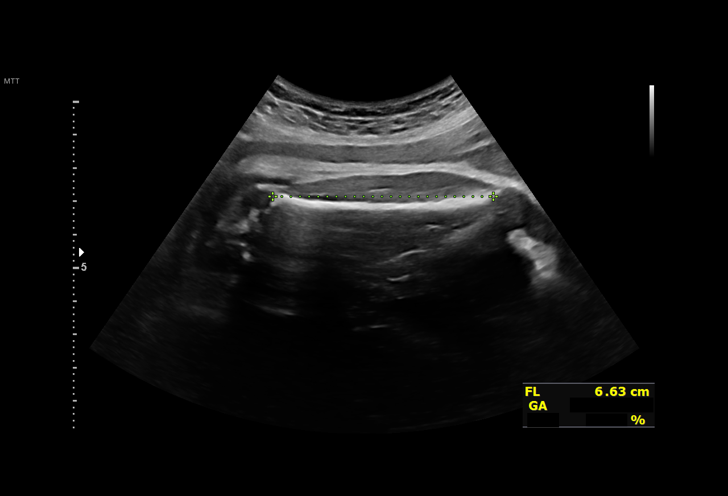
[im 37/56]
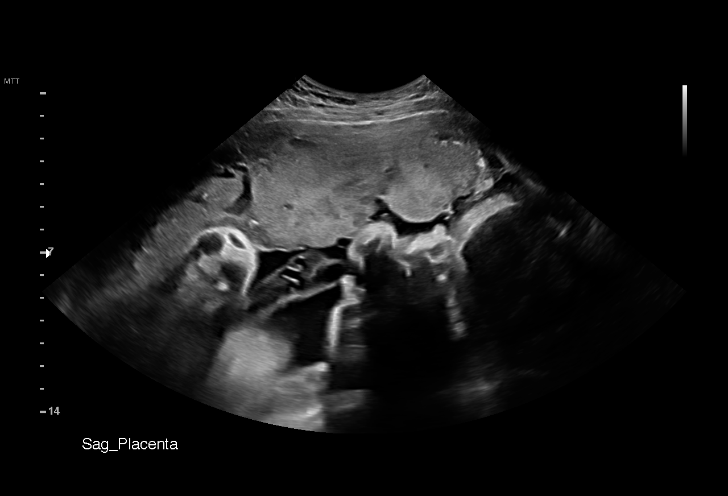
[im 41/56]
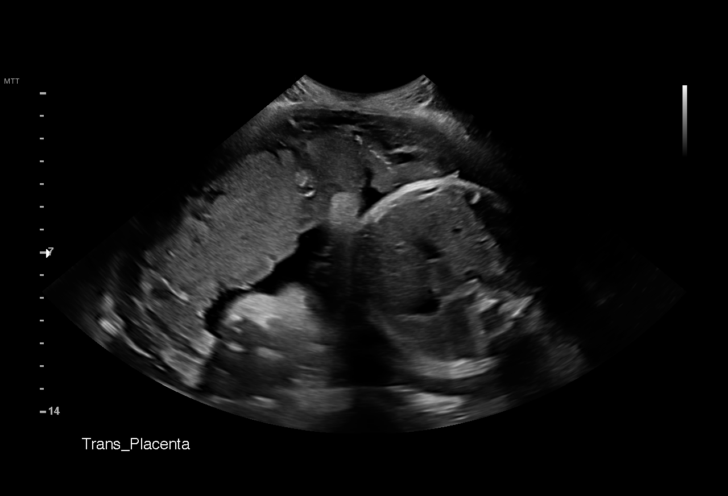
[im 45/56]
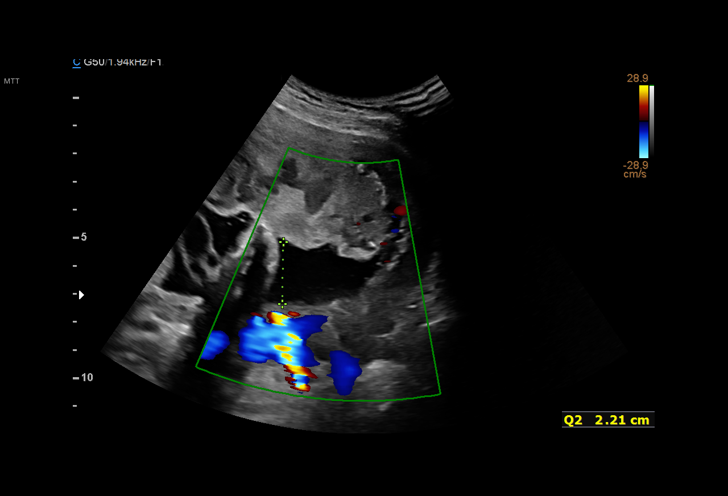
[im 49/56]
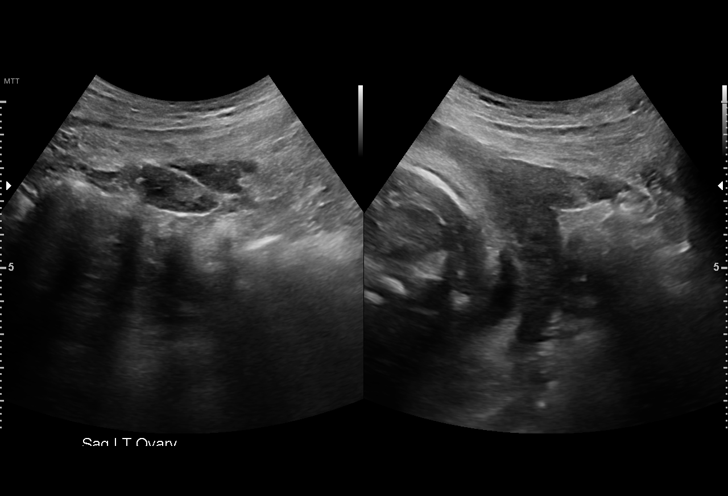
[im 53/56]
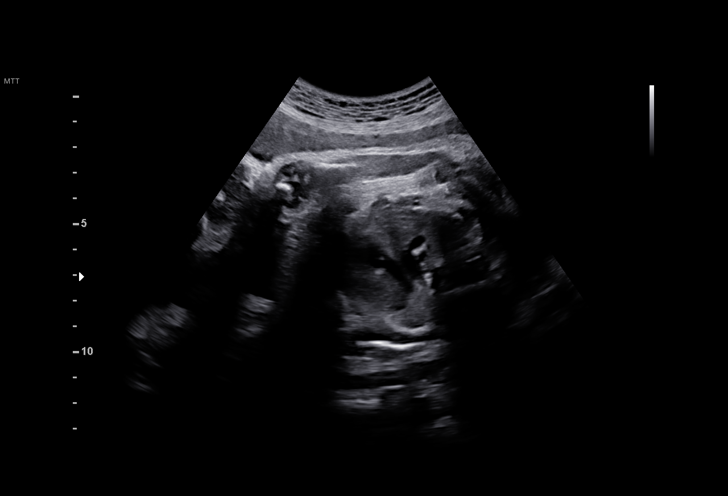

[13 of 28 positions shown; findings below may reference images not displayed]

Indications

 Maternal care for known or suspected poor
 fetal growth, third trimester, fetus 1 IUGR
 35 weeks gestation of pregnancy
 Genetic carrier (Hgb Erxleben, [place])
 Poor obstetric history: Previous fetal growth
 restriction (FGR) Induced at 37 weeks, Child
 4 lbs
 Encounter for other antenatal screening
 follow-up
Fetal Evaluation

 Num Of Fetuses:         1
 Fetal Heart Rate(bpm):  136
 Cardiac Activity:       Observed
 Presentation:           Cephalic
 Placenta:               Anterior
 P. Cord Insertion:      Previously Visualized

 Amniotic Fluid
 AFI FV:      Within normal limits

 AFI Sum(cm)     %Tile       Largest Pocket(cm)
 12.3            38
 RUQ(cm)       RLQ(cm)       LUQ(cm)        LLQ(cm)

Biophysical Evaluation

 Amniotic F.V:   Pocket => 2 cm             F. Tone:        Observed
 F. Movement:    Observed                   Score:          [DATE]
 F. Breathing:   Observed
Biometry

 BPD:      81.9  mm     G. Age:  32w 6d          5  %    CI:        78.83   %    70 - 86
                                                         FL/HC:      22.5   %    20.1 -
 HC:      291.7  mm     G. Age:  32w 1d        < 1  %    HC/AC:      1.02        0.93 -
 AC:      285.1  mm     G. Age:  32w 4d        3.4  %    FL/BPD:     80.2   %    71 - 87
 FL:       65.7  mm     G. Age:  33w 6d         14  %    FL/AC:      23.0   %    20 - 24
 CER:        46  mm     G. Age:  35w 2d         38  %
 LV:        4.4  mm

 Est. FW:    3841  gm      4 lb 9 oz      5  %
OB History

 Blood Type:   B+
 Gravidity:    2         Term:   1        Prem:   0        SAB:   0
 TOP:          0       Ectopic:  0        Living: 1
Gestational Age

 LMP:           35w 1d        Date:  11/15/19                 EDD:   08/21/20
 U/S Today:     32w 6d                                        EDD:   09/06/20
 Best:          35w 1d     Det. By:  LMP  (11/15/19)          EDD:   08/21/20
Anatomy

 Cranium:               Appears normal         Aortic Arch:            Previously seen
 Cavum:                 Appears normal         Ductal Arch:            Previously seen
 Ventricles:            Appears normal         Diaphragm:              Appears normal
 Choroid Plexus:        Previously seen        Stomach:                Appears normal, left
                                                                       sided
 Cerebellum:            Previously seen        Abdomen:                Previously seen
 Posterior Fossa:       Previously seen        Abdominal Wall:         Previously seen
 Nuchal Fold:           Previously seen        Cord Vessels:           Previously seen
 Face:                  Orbits nl; profile not Kidneys:                Appear normal
                        well visualized
 Lips:                  Previously seen        Bladder:                Appears normal
 Thoracic:              Appears normal         Spine:                  Previously seen
 Heart:                 Appears normal         Upper Extremities:      Previously seen
                        (4CH, axis, and
                        situs)
 RVOT:                  Previously seen        Lower Extremities:      Previously seen
 LVOT:                  Previously seen

 Other:  Heels, Right and Left 5th digit visualized previously. Female gender
         previously seen. Technically difficult due to advanced GA and fetal
         position.
Doppler - Fetal Vessels
 Umbilical Artery
  S/D     %tile      RI    %tile                             ADFV    RDFV
     3       80    0.67       84                                No      No

Cervix Uterus Adnexa

 Cervix
 Not visualized (advanced GA >48wks)

 Uterus
 No abnormality visualized.

 Right Ovary
 Within normal limits. No adnexal mass visualized.

 Left Ovary
 Within normal limits. No adnexal mass visualized.

 Cul De Sac
 No free fluid seen.

 Adnexa
 No abnormality visualized.
Comments

 This patient was seen for a follow up growth scan due to fetal
 growth restriction noted during her prior ultrasound exams.
 She denies any problems since her last exam and reports
 feeling vigorous fetal movements throughout the day.
 On today's exam, the EFW measures at the 5th percentile for
 her gestational age indicating fetal growth restriction.  The
 fetus has grown over 1 pound over the past 3 weeks.  There
 was normal amniotic fluid noted.
 A biophysical profile performed today due to fetal growth
 restriction was [DATE].
 Doppler studies of the umbilical arteries showed a normal
 S/D ratio of 3.00.  There were no signs of absent or reversed
 end-diastolic flow.
 Another biophysical profile and umbilical artery Doppler study
 was scheduled in 1 week.
 Due to fetal growth restriction, delivery is recommended at
 around 37-38 weeks.

## 2022-02-08 IMAGING — US US MFM FETAL BPP W/ NON-STRESS
1 series · 12 of 28 positions shown · non-contrast
Comparison: none

[Series 1: us mfm fetal bpp w/ non-stress · 39 acquisitions, 12 frames shown]
[im 2/39]
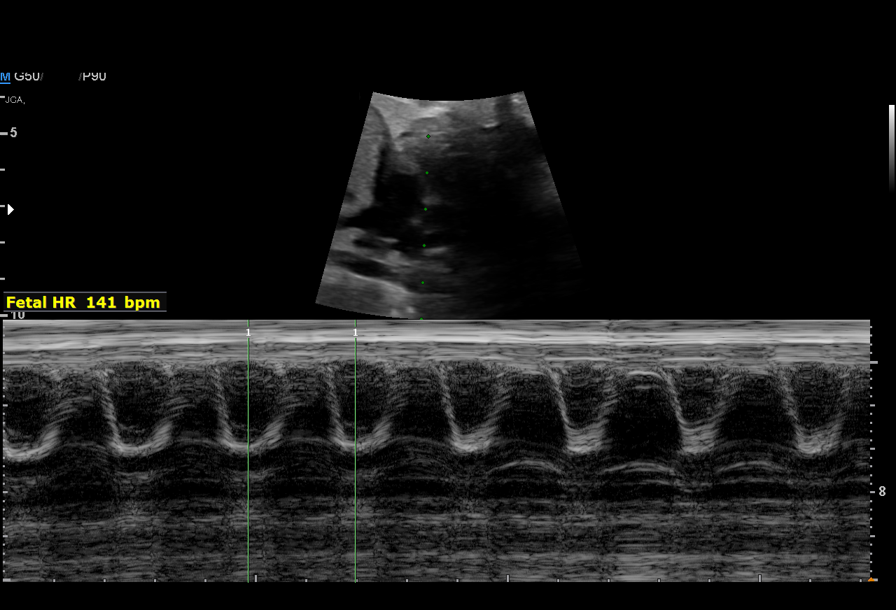
[im 5/39]
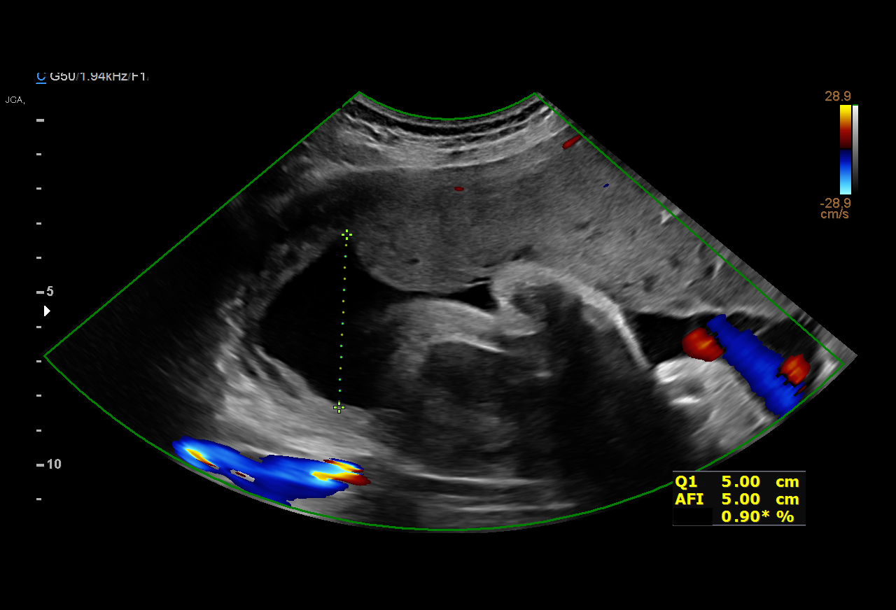
[im 8/39]
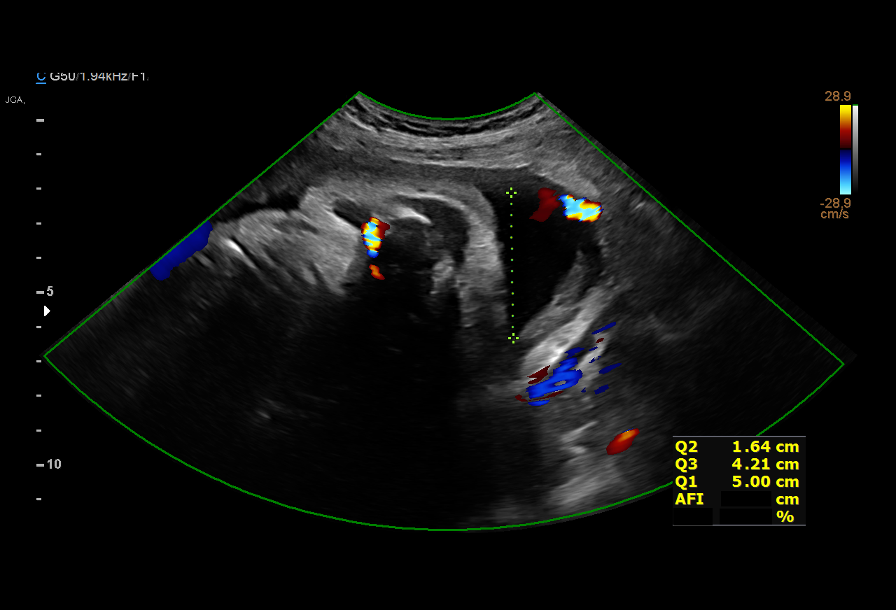
[im 12/39]
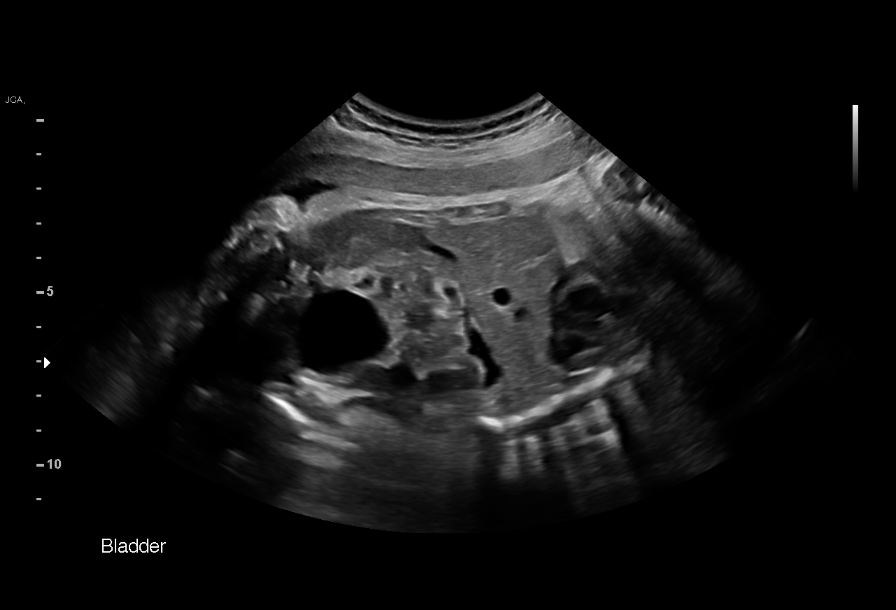
[im 15/39]
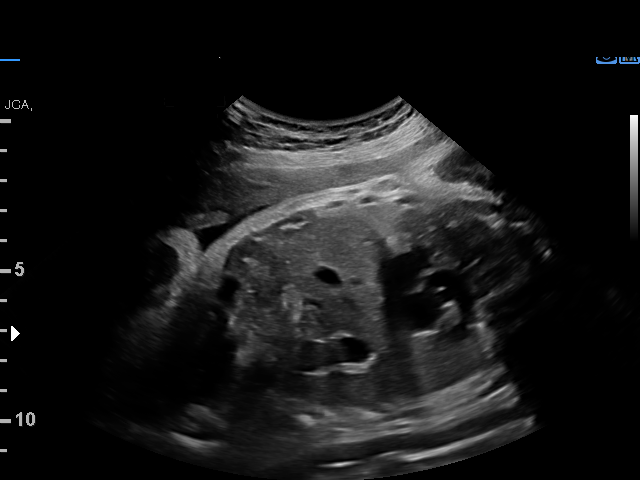
[im 17/39]
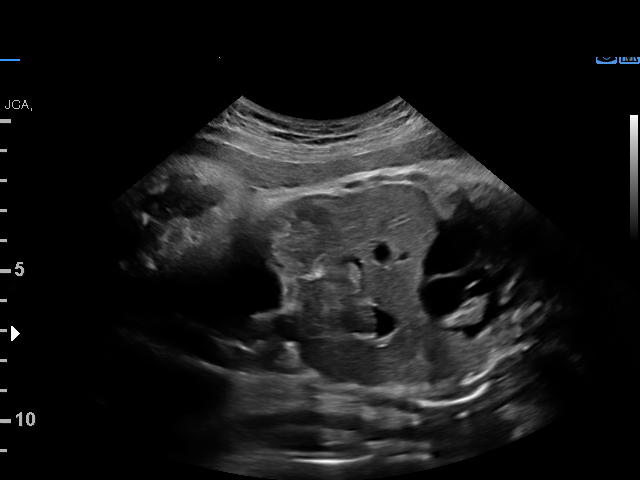
[im 22/39]
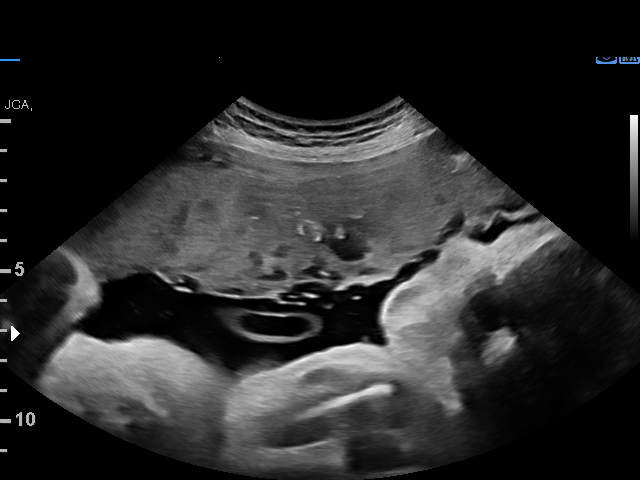
[im 24/39]
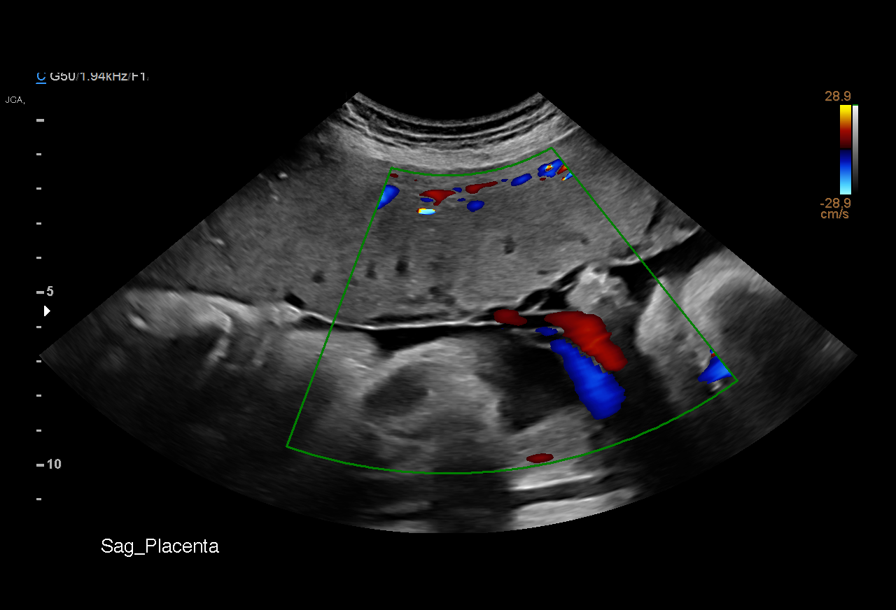
[im 27/39]
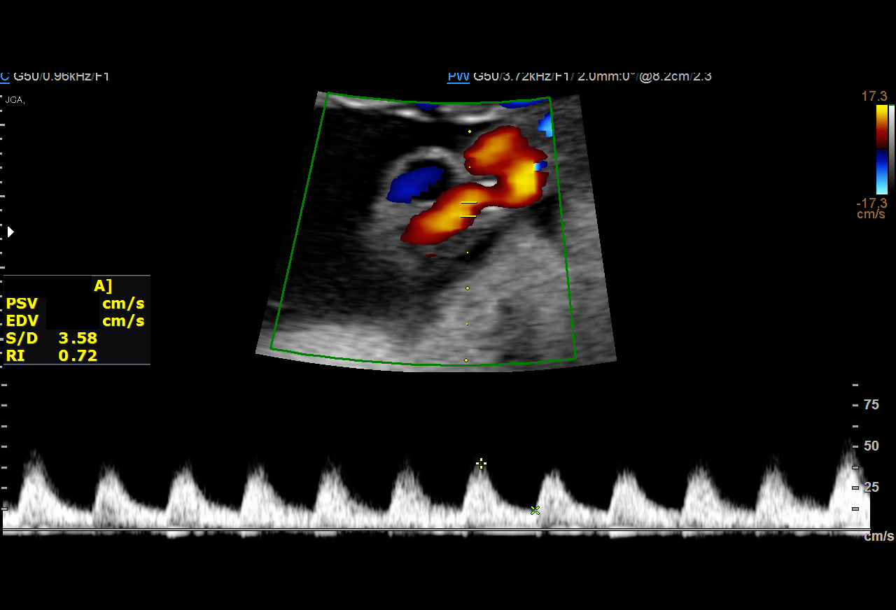
[im 31/39]
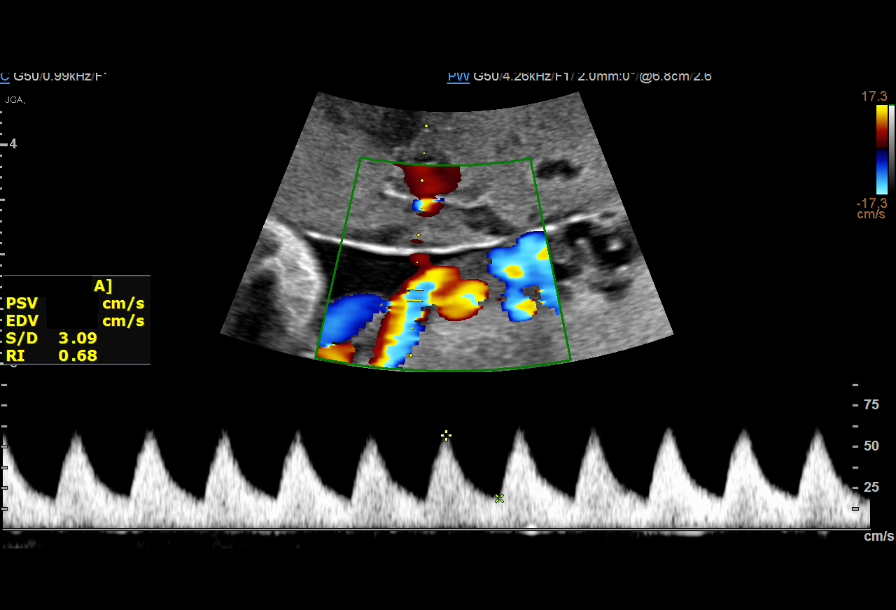
[im 34/39]
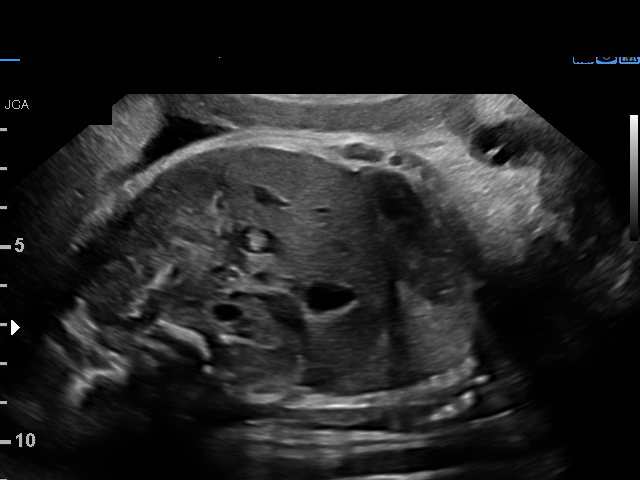
[im 37/39]
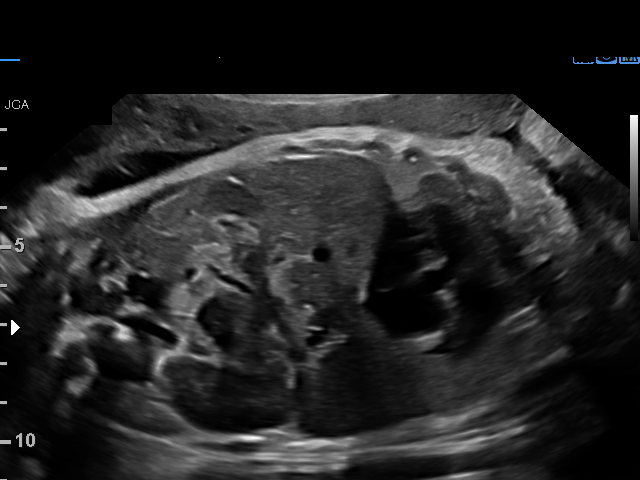

[12 of 28 positions shown; findings below may reference images not displayed]

W/NONSTRESS

Indications

 Maternal care for known or suspected poor
 fetal growth, third trimester, fetus 1 IUGR
 Poor obstetric history: Previous fetal growth
 restriction (FGR) Induced at 37 weeks, Child
 4 lbs
 Genetic carrier (Hgb Nicha, [place])
 Encounter for other antenatal screening
 follow-up
 36 weeks gestation of pregnancy
Fetal Evaluation

 Num Of Fetuses:         1
 Fetal Heart Rate(bpm):  141
 Cardiac Activity:       Observed
 Presentation:           Cephalic
 Placenta:               Anterior
 P. Cord Insertion:      Visualized

 Amniotic Fluid
 AFI FV:      Within normal limits

 AFI Sum(cm)     %Tile       Largest Pocket(cm)
 13.92           51          5.
 RUQ(cm)       RLQ(cm)       LUQ(cm)        LLQ(cm)
 5
Biophysical Evaluation

 Amniotic F.V:   Pocket => 2 cm             F. Tone:        Observed
 F. Movement:    Observed                   N.S.T:          Reactive
 F. Breathing:   Not Observed               Score:          [DATE]
OB History

 Blood Type:   B+
 Gravidity:    2         Term:   1        Prem:   0        SAB:   0
 TOP:          0       Ectopic:  0        Living: 1
Gestational Age

 LMP:           36w 1d        Date:  11/15/19                 EDD:   08/21/20
 Best:          36w 1d     Det. By:  LMP  (11/15/19)          EDD:   08/21/20
Doppler - Fetal Vessels

 Umbilical Artery
  S/D     %tile      RI    %tile                             ADFV    RDFV
  3.09       85    0.68       90                                No      No

Impression

 Fetal growth restriction.  Patient return for BPP and umbilical
 artery Doppler studies.
 Amniotic fluid is normal and good fetal activity is seen .
 Umbilical artery Doppler showed normal forward diastolic
 flow.Fetal breathing movements did not meet the criteria
 (BPP) .  NST is reactive.  BPP [DATE].

 We reassured the patient of the findings.

 She will be undergoing induction of labor on 07/31/2020.
Recommendations

 No follow-up appointments were ma[REDACTED]

## 2022-06-12 ENCOUNTER — Other Ambulatory Visit (HOSPITAL_COMMUNITY)
Admission: RE | Admit: 2022-06-12 | Discharge: 2022-06-12 | Disposition: A | Discharge status: Home / Self Care | Payer: Medicaid Other | Source: Ambulatory Visit | Attending: Obstetrics & Gynecology | Admitting: Obstetrics & Gynecology

## 2022-06-12 ENCOUNTER — Ambulatory Visit: Payer: Medicaid Other | Admitting: Obstetrics & Gynecology

## 2022-06-12 VITALS — BP 109/75 | HR 91 | Wt 127.0 lb

## 2022-06-12 DIAGNOSIS — A749 Chlamydial infection, unspecified: Secondary | ICD-10-CM | POA: Diagnosis not present

## 2022-06-12 DIAGNOSIS — N939 Abnormal uterine and vaginal bleeding, unspecified: Secondary | ICD-10-CM | POA: Insufficient documentation

## 2022-06-12 DIAGNOSIS — R102 Pelvic and perineal pain: Secondary | ICD-10-CM | POA: Diagnosis not present

## 2022-06-12 LAB — POCT URINALYSIS DIPSTICK
Bilirubin, UA: NEGATIVE
Glucose, UA: NEGATIVE
Ketones, UA: NEGATIVE
Leukocytes, UA: NEGATIVE
Nitrite, UA: NEGATIVE
Protein, UA: NEGATIVE
Spec Grav, UA: 1.01 (ref 1.010–1.025)
Urobilinogen, UA: 0.2 E.U./dL
pH, UA: 6 (ref 5.0–8.0)

## 2022-06-12 NOTE — Progress Notes (Signed)
GYNECOLOGY OFFICE VISIT NOTE  History:   Leah Price is a 21 y.o. [id number] here today for evaluation of irregular menstrual peridos for about a year. Reports having bleeding every other week, currently having heavy bleeding which makes her feel weak and tired. Was on OCPs after delivery in 2021, stopped these as they made her "feel bad and not like myself".  Bleeding is associated occasionally with pelvic pain, worse with coughing/sneezing.  Also reports upper abdominal pain, constipation and appetite changes.  Denies any abnormal vaginal discharge, fevers, chills, sweats, dysuria, nausea, vomiting, other GI or GU symptoms or other general symptoms.    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   Health Maintenance:  Normal pap on 08/30/1020.   Review of Systems:  Pertinent items noted in HPI and remainder of comprehensive ROS otherwise negative.  Physical Exam:  BP 109/75   Pulse 91   Wt 127 lb (57.6 kg)   LMP 06/03/2022   BMI 21.13 kg/m  CONSTITUTIONAL: Well-developed, well-nourished female in no acute distress.  HEENT:  Normocephalic, atraumatic. External right and left ear normal. No scleral icterus.  NECK: Normal range of motion, supple, no masses noted on observation SKIN: No rash noted. Not diaphoretic. No erythema. No pallor. MUSCULOSKELETAL: Normal range of motion. No edema noted. NEUROLOGIC: Alert and oriented to person, place, and time. Normal muscle tone coordination. No cranial nerve deficit noted. PSYCHIATRIC: Normal mood and affect. Normal behavior. Normal judgment and thought  content. CARDIOVASCULAR: Normal heart rate noted RESPIRATORY: Effort and breath sounds normal, no problems with respiration noted ABDOMEN: No masses noted. No other overt distention noted.   PELVIC: Deferred  Labs Results for orders placed or performed in visit on 06/12/22 (from the past 24 hour(s))  POCT Urinalysis Dipstick     Status: None   Collection Time: 06/12/22  3:09 PM  Result Value Ref Range   Color, UA pink    Clarity, UA cloudy    Glucose, UA Negative Negative   Bilirubin, UA negative    Ketones, UA negative    Spec Grav, UA 1.010 1.010 - 1.025   Blood, UA large    pH, UA 6.0 5.0 - 8.0   Protein, UA Negative Negative   Urobilinogen, UA 0.2 0.2 or 1.0 E.U./dL   Nitrite, UA negative    Leukocytes, UA Negative Negative   Appearance     Odor         Assessment and Plan:     1. Pelvic pain Unsure of etiology, pelvic ultrasound and labs ordered. Likely not related to upper abdominal pain, she needs further evaluation for that if this continues. - US PELVIC COMPLETE WITH TRANSVAGINAL; Future - POCT Urinalysis Dipstick showed large blood, urine culture sent. - Urine cytology ancillary only([place]) - Urine Culture  2. Abnormal uterine bleeding (AUB) Likely anovulatory bleeding, recommended trial of low dose hormonal therapy and can do low dose COC or progestin only therapy. She declined this. Also recommended NSAIDs and/or Lysteda, she also declined this for now. Will follow up studies and manage accordingly.  - US PELVIC COMPLETE WITH TRANSVAGINAL; Future - Beta hCG  quant (ref lab) - TSH+Prl+TestT+TestF+17OHP - Von Willebrand panel - Urine cytology ancillary only([place]) - CBC Routine preventative health maintenance measures emphasized. Please refer to After Visit Summary for other counseling recommendations.   Return in about 1 month (around 07/13/2022) for Followup.    I spent 30 minutes dedicated to the care of this patient including pre-visit review  of records, face to face time with the patient discussing her conditions and treatments and post visit orders.    Jaynie Collins, MD, FACOG Obstetrician & Gynecologist, [facility] for [employer], [facility]

## 2022-06-13 ENCOUNTER — Encounter
Admission: RE | Admit: 2022-06-13 | Discharge: 2022-06-13 | Disposition: A | Discharge status: Home / Self Care | Payer: Medicaid Other | Source: Ambulatory Visit | Attending: Obstetrics & Gynecology | Admitting: Obstetrics & Gynecology

## 2022-06-13 DIAGNOSIS — N939 Abnormal uterine and vaginal bleeding, unspecified: Secondary | ICD-10-CM | POA: Insufficient documentation

## 2022-06-13 DIAGNOSIS — R102 Pelvic and perineal pain: Secondary | ICD-10-CM | POA: Diagnosis present

## 2022-06-13 LAB — URINE CYTOLOGY ANCILLARY ONLY
Chlamydia: POSITIVE — AB
Comment: NEGATIVE
Comment: NEGATIVE
Comment: NORMAL
Neisseria Gonorrhea: NEGATIVE
Trichomonas: NEGATIVE

## 2022-06-14 ENCOUNTER — Encounter: Payer: Self-pay | Admitting: Obstetrics & Gynecology

## 2022-06-14 ENCOUNTER — Telehealth: Payer: Self-pay | Admitting: Emergency Medicine

## 2022-06-14 DIAGNOSIS — A749 Chlamydial infection, unspecified: Secondary | ICD-10-CM

## 2022-06-14 DIAGNOSIS — R102 Pelvic and perineal pain: Secondary | ICD-10-CM | POA: Insufficient documentation

## 2022-06-14 DIAGNOSIS — N939 Abnormal uterine and vaginal bleeding, unspecified: Secondary | ICD-10-CM | POA: Insufficient documentation

## 2022-06-14 HISTORY — DX: Chlamydial infection, unspecified: A74.9

## 2022-06-14 LAB — URINE CULTURE

## 2022-06-14 MED ORDER — DOXYCYCLINE HYCLATE 100 MG PO CAPS: 100.0000 mg | ORAL_CAPSULE | Freq: Two times a day (BID) | ORAL | 1 refills | Status: AC

## 2022-06-14 NOTE — Addendum Note (Signed)
Addended by: Verita Schneiders A on: 06/14/2022 08:21 AM   Modules accepted: Orders

## 2022-06-14 NOTE — Progress Notes (Signed)
Patient has chlamydia.  Recommend testing for other STIs, also needs to let partner(s) know so the partner(s) can get testing and treatment. Patient and sex partner(s) should abstain from unprotected sexual activity for seven days after everyone receives appropriate treatment.  Doxycycline was prescribed for patient.  Patient can return in about 4 weeks after treatment for repeat test of cure.  Please call to inform patient of results and recommendations, and advise to pick up prescription and take as directed.    Also please inform patient chlamydia and other infections affecting the uterus and ovaries can be associated with irregular bleeding and pelvic pain.   Verita Schneiders, MD

## 2022-06-14 NOTE — Telephone Encounter (Signed)
TC to patient to discuss results and Rx. 

## 2022-06-17 LAB — CBC
Hematocrit: 36.9 % (ref 34.0–46.6)
Hemoglobin: 11.7 g/dL (ref 11.1–15.9)
MCH: 24.7 pg — ABNORMAL LOW (ref 26.6–33.0)
MCHC: 31.7 g/dL (ref 31.5–35.7)
MCV: 78 fL — ABNORMAL LOW (ref 79–97)
Platelets: 362 10*3/uL (ref 150–450)
RBC: 4.73 x10E6/uL (ref 3.77–5.28)
RDW: 15.7 % — ABNORMAL HIGH (ref 11.7–15.4)
WBC: 7 10*3/uL (ref 3.4–10.8)

## 2022-06-17 LAB — TSH+PRL+TESTT+TESTF+17OHP
17-Hydroxyprogesterone: 26 ng/dL
Prolactin: 15.4 ng/mL (ref 4.8–23.3)
TSH: 0.606 u[IU]/mL (ref 0.450–4.500)
Testosterone, Free: 1 pg/mL (ref 0.0–4.2)
Testosterone, Total, LC/MS: 24.5 ng/dL (ref 10.0–55.0)

## 2022-06-17 LAB — COAG STUDIES INTERP REPORT

## 2022-06-17 LAB — BETA HCG QUANT (REF LAB): hCG Quant: <1 m[IU]/mL

## 2022-06-17 LAB — VON WILLEBRAND PANEL
Factor VIII Activity: 103 % (ref 56–140)
Von Willebrand Ag: 123 % (ref 50–200)
Von Willebrand Factor: 51 % (ref 50–200)

## 2022-06-18 ENCOUNTER — Ambulatory Visit (INDEPENDENT_AMBULATORY_CARE_PROVIDER_SITE_OTHER): Payer: Medicaid Other | Admitting: General Practice

## 2022-06-18 VITALS — BP 113/74 | HR 88 | Ht 65.0 in | Wt 128.9 lb

## 2022-06-18 DIAGNOSIS — Z113 Encounter for screening for infections with a predominantly sexual mode of transmission: Secondary | ICD-10-CM

## 2022-06-18 NOTE — Progress Notes (Signed)
SUBJECTIVE:  21 y.o. female presents for STD testing. Pt tested positive for Chlamydia 06-12-22 and is requesting further testing.  Denies abnormal vaginal bleeding or significant pelvic pain or fever. No UTI symptoms.   Pt tested positive for Chlamydia 06/12/22. Pt is requesting further STD testing today.  Patient's last menstrual period was 06/03/2022.  OBJECTIVE:  She appears well, afebrile. Urine dipstick: not done.  ASSESSMENT:  Vaginal Discharge  Vaginal Odor   PLAN:  GC, chlamydia, trichomonas, BVAG, CVAG probe sent to lab. Treatment: To be determined once lab results are received. Return in 4 weeks for TOC. ROV prn if symptoms persist or worsen.

## 2022-06-19 ENCOUNTER — Other Ambulatory Visit: Payer: Medicaid Other

## 2022-06-19 LAB — RPR: RPR Ser Ql: NONREACTIVE

## 2022-06-19 LAB — HIV ANTIBODY (ROUTINE TESTING W REFLEX): HIV Screen 4th Generation wRfx: NONREACTIVE

## 2022-06-19 LAB — HEPATITIS B SURFACE ANTIGEN: Hepatitis B Surface Ag: NEGATIVE

## 2022-06-19 LAB — HEPATITIS C ANTIBODY: Hep C Virus Ab: NONREACTIVE

## 2022-07-18 ENCOUNTER — Ambulatory Visit: Payer: Medicaid Other | Admitting: Obstetrics & Gynecology

## 2022-07-24 ENCOUNTER — Encounter: Payer: Self-pay | Admitting: Advanced Practice Midwife

## 2022-07-24 ENCOUNTER — Other Ambulatory Visit (HOSPITAL_COMMUNITY)
Admission: RE | Admit: 2022-07-24 | Discharge: 2022-07-24 | Disposition: A | Discharge status: Home / Self Care | Payer: Medicaid Other | Source: Ambulatory Visit | Attending: Obstetrics & Gynecology | Admitting: Obstetrics & Gynecology

## 2022-07-24 ENCOUNTER — Ambulatory Visit: Payer: Medicaid Other | Admitting: Advanced Practice Midwife

## 2022-07-24 VITALS — BP 111/75 | HR 86 | Wt 124.0 lb

## 2022-07-24 DIAGNOSIS — A749 Chlamydial infection, unspecified: Secondary | ICD-10-CM

## 2022-07-24 DIAGNOSIS — Z113 Encounter for screening for infections with a predominantly sexual mode of transmission: Secondary | ICD-10-CM | POA: Insufficient documentation

## 2022-07-24 DIAGNOSIS — Z01419 Encounter for gynecological examination (general) (routine) without abnormal findings: Secondary | ICD-10-CM | POA: Diagnosis not present

## 2022-07-24 LAB — POCT URINE PREGNANCY: Preg Test, Ur: NEGATIVE

## 2022-07-24 NOTE — Progress Notes (Signed)
   Subjective:     Leah Price is a 21 y.o. female here at [facility] for a routine exam.  Current complaints: none.  Personal health questionnaire reviewed: yes.  Do you have a primary care provider? yes Do you feel safe at home? yes    Health Maintenance Due  Topic Date Due   COVID-19 Vaccine (1) Never done   HPV VACCINES (1 - 2-dose series) Never done   INFLUENZA VACCINE  Never done     Risk factors for chronic health problems: Smoking: Alchohol/how much: Pt BMI: Body mass index is 20.63 kg/m.   Gynecologic History Patient's last menstrual period was 07/04/2022. Contraception: none Last Pap: 08/30/2020. Results were: normal Last mammogram: n/a.   Obstetric History OB History  Gravida Para Term Preterm AB Living  2 2 2     2   SAB IAB Ectopic Multiple Live Births        0 2    # Outcome Date GA Lbr Len/2nd Weight Sex Delivery Anes PTL Lv  2 Term 07/31/20 [redacted]w[redacted]d 00:10 / 00:05 5 lb 0.1 oz (2.271 kg) F Vag-Spont EPI  LIV  1 Term 08/15/18 [redacted]w[redacted]d  4 lb 15 oz (2.24 kg)  Vag-Spont   LIV     The following portions of the patient's history were reviewed and updated as appropriate: allergies, current medications, past family history, past medical history, past social history, past surgical history, and problem list.  Review of Systems Pertinent items noted in HPI and remainder of comprehensive ROS otherwise negative.    Objective:   VS reviewed, nursing note reviewed,  Constitutional: well developed, well nourished, no distress HEENT: normocephalic, thyroid without enlargement or mass HEART: RRR, no murmurs rubs/gallops RESP: clear and equal to auscultation bilaterally in all lobes  Breast Exam:  Deferred with low risks and shared decision making, discussed recommendation to start mammogram between 40-50 yo/  Abdomen: soft Neuro: alert and oriented x 3 Skin: warm, dry Psych: affect normal Pelvic exam: Bimanual exam: Cervix 0/long/high, firm, anterior, neg CMT,  uterus nontender, nonenlarged, adnexa without tenderness, enlargement, or mass        Assessment/Plan:   1. Well woman exam with routine gynecological exam --No gyn concerns, normal Pap in 2021, due 2024 --Pt undecided about planning pregnancy or preventing. She does not desire contraception but is using an app to track periods.  Her period is monthly but with some in between days of bleeding as well.  Discussed fertility awareness and use of a calendar to predict fertile days.  Pt and her s/o, who is present today, are to discuss and decide when to plan a pregnancy.  - POCT urine pregnancy  2. Screen for STD (sexually transmitted disease) --Pt had negative serum testing 06/17/22 --Positive for chlamydia 10/16, treated  - Cervicovaginal ancillary only( [place])     Return in about 1 year (around 07/25/2023) for annual exam.   07/27/2023, CNM 1:18 PM

## 2022-07-25 LAB — CERVICOVAGINAL ANCILLARY ONLY
Chlamydia: NEGATIVE
Comment: NEGATIVE
Comment: NEGATIVE
Comment: NORMAL
Neisseria Gonorrhea: NEGATIVE
Trichomonas: NEGATIVE

## 2022-09-03 NOTE — L&D Delivery Note (Signed)
OB/GYN Faculty Practice Delivery Note  Leah Price is a 22 y.o. [id number] s/p VD at [redacted]w[redacted]d. She was admitted for SOL/SROM.   ROM: 2h 48m with clear fluid GBS Status:  Negative/-- (07/22 1433) Maximum Maternal Temperature: 98.2 F  Labor Progress: Initial SVE: 6/80/-2. She then progressed to complete.   Delivery Date/Time: 04/12/2023 at 0142 Delivery: Called to room and patient was complete and pushing. Head delivered ROA, compound hand. No nuchal cord present. Shoulder and body delivered in usual fashion. Infant with spontaneous cry, placed on mother's abdomen, dried and stimulated. Cord clamped x 2 after 1-minute delay, and cut by FOB. Cord blood drawn. Placenta delivered spontaneously with gentle cord traction. Fundus firm with massage and Pitocin. Labia, perineum, vagina, and cervix inspected with small periurethral, hemostatic.  Baby Weight: pending  Placenta: 3 vessel, intact. Sent to L&D Complications: None Lacerations: As above EBL: 82 mL Analgesia: None  Infant:  APGAR (1 MIN): 9  APGAR (5 MINS): 9   Myrtie Hawk, DO OB Family Medicine Fellow, [facility] for [employer], [facility] 04/12/2023, 2:07 AM

## 2022-09-19 ENCOUNTER — Other Ambulatory Visit: Payer: Self-pay

## 2022-09-19 ENCOUNTER — Inpatient Hospital Stay (HOSPITAL_COMMUNITY)
Admission: AD | Admit: 2022-09-19 | Discharge: 2022-09-19 | Disposition: A | Discharge status: Home / Self Care | Payer: Medicaid Other | Attending: Obstetrics and Gynecology | Admitting: Obstetrics and Gynecology

## 2022-09-19 ENCOUNTER — Telehealth: Payer: Self-pay | Admitting: *Deleted

## 2022-09-19 DIAGNOSIS — R102 Pelvic and perineal pain: Secondary | ICD-10-CM | POA: Diagnosis not present

## 2022-09-19 DIAGNOSIS — Z641 Problems related to multiparity: Secondary | ICD-10-CM | POA: Diagnosis not present

## 2022-09-19 DIAGNOSIS — O0941 Supervision of pregnancy with grand multiparity, first trimester: Secondary | ICD-10-CM | POA: Diagnosis not present

## 2022-09-19 DIAGNOSIS — O26891 Other specified pregnancy related conditions, first trimester: Secondary | ICD-10-CM | POA: Diagnosis present

## 2022-09-19 DIAGNOSIS — Z3A09 9 weeks gestation of pregnancy: Secondary | ICD-10-CM | POA: Insufficient documentation

## 2022-09-19 LAB — URINALYSIS, MICROSCOPIC (REFLEX): RBC / HPF: NONE SEEN RBC/hpf (ref 0–5)

## 2022-09-19 LAB — URINALYSIS, ROUTINE W REFLEX MICROSCOPIC
Bilirubin Urine: NEGATIVE
Glucose, UA: NEGATIVE mg/dL
Hgb urine dipstick: NEGATIVE
Ketones, ur: NEGATIVE mg/dL
Nitrite: NEGATIVE
Protein, ur: NEGATIVE mg/dL
Specific Gravity, Urine: 1.015 (ref 1.005–1.030)
pH: 7 (ref 5.0–8.0)

## 2022-09-19 LAB — POCT PREGNANCY, URINE: Preg Test, Ur: POSITIVE — AB

## 2022-09-19 NOTE — Telephone Encounter (Signed)
TC from pt reporting pregnancy at [redacted]w[redacted]d, confirmed with [place] at [facility]. Reports abdominal cramping since 3 AM. Denies VB. Reports "pulling" sensation when walking. Advised to seek care in MAU. Instructions on location provided.

## 2022-09-19 NOTE — MAU Note (Signed)
Leah Price is a 22 y.o. here in [facility]U reporting: lower abdominal cramping since last night. No bleeding or discharge.   Had a UPT and u/s in a mobile clinic. States they saw a live pregnancy.  LMP: 07/04/22  Onset of complaint: last night  Pain score: 6/10  Vitals:   09/19/22 1249  BP: 112/61  Pulse: 79  Resp: 16  Temp: 98.7 F (37.1 C)  SpO2: 100%     FHT:NA  Lab orders placed from triage: upt, ua

## 2022-09-19 NOTE — MAU Provider Note (Signed)
History     [id number]  Arrival date and time: 09/19/22 1118    Chief Complaint  Patient presents with   Abdominal Pain     HPI Leah Price is a 22 y.o. at Unknown by LMP with PMHx notable for 2 prior NSVD, who presents for pelvic pain.   Patient reports confirmation of pregnancy about 3 weeks ago with a mobile clinic in a [place] They told her that time she was about [redacted] weeks pregnant Last night she began to experience some intermittent cramping pain She did not experience this with any of her other pregnancies No vaginal bleeding or discharge No burning or pain with urination She has an appointment scheduled to initiate prenatal care at [facility]  2   Para  2   Term  2   Preterm      AB      Living  2      SAB      IAB      Ectopic      Multiple  0   Live Births  2           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    Past Surgical History:  Procedure Laterality Date   NO PAST SURGERIES      Family History  Problem Relation Age of Onset   40 / Stillbirths Mother    Hyperlipidemia Maternal Grandmother    ADD / ADHD Neg Hx    Alcohol abuse Neg Hx    Anxiety disorder Neg Hx    Arthritis Neg Hx    Asthma Neg Hx    Birth defects Neg Hx    Cancer Neg Hx    COPD Neg Hx    Depression Neg Hx    Diabetes Neg Hx    Drug abuse Neg Hx    Hearing loss Neg Hx    Heart disease Neg Hx    Hypertension Neg Hx    Intellectual disability Neg Hx    Kidney disease Neg Hx    Learning disabilities Neg Hx    Obesity Neg Hx    Stroke Neg Hx    Vision loss Neg Hx    Varicose Veins Neg Hx     Social History   Socioeconomic History   Marital status: Single    Spouse name: Elberta Fortis   Number of children: 1   Years of education: Not on file   Highest education level: 12th grade  Occupational  History   Not on file  Tobacco Use   Smoking status: Former    Passive exposure: Never   Smokeless tobacco: Never  Vaping Use   Vaping Use: Never used  Substance and Sexual Activity   Alcohol use: Not Currently   Drug use: Never   Sexual activity: Yes    Birth control/protection: None  Other Topics Concern   Not on file  Social History Narrative   Not on file   Social Determinants of Health   Financial Resource Strain: Low Risk  (08/12/2018)   Overall Financial Resource Strain (CARDIA)    Difficulty of Paying Living Expenses: Not hard at all  Food Insecurity: No Food Insecurity (08/12/2018)   Hunger Vital Sign    Worried About Running Out of Food in the Last Year: Never true  Ran Out of Food in the Last Year: Never true  Transportation Needs: Unknown (08/12/2018)   PRAPARE - Administrator, Civil Service (Medical): No    Lack of Transportation (Non-Medical): Not on file  Physical Activity: Not on file  Stress: No Stress Concern Present (08/12/2018)   [employer] of Occupational Health - Occupational Stress Questionnaire    Feeling of Stress : Not at all  Social Connections: Not on file  Intimate Partner Violence: Not At Risk (08/12/2018)   Humiliation, Afraid, Rape, and Kick questionnaire    Fear of Current or Ex-Partner: No    Emotionally Abused: No    Physically Abused: No    Sexually Abused: No    Allergies  Allergen Reactions   Shrimp Extract Allergy Skin Test     No current facility-administered medications on file prior to encounter.   No current outpatient medications on file prior to encounter.     ROS Pertinent positives and negative per HPI, all others reviewed and negative  Physical Exam   BP 112/61 (BP Location: Right Arm)   Pulse 79   Temp 98.7 F (37.1 C) (Oral)   Resp 16   Ht 5\' 5"  (1.651 m)   Wt 56.3 kg   LMP 07/04/2022   SpO2 100% Comment: room air  BMI 20.65 kg/m   Patient Vitals for the past 24 hrs:  BP  Temp Temp src Pulse Resp SpO2 Height Weight  09/19/22 1249 112/61 98.7 F (37.1 C) Oral 79 16 100 % -- --  09/19/22 1246 -- -- -- -- -- -- 5\' 5"  (1.651 m) 56.3 kg    Physical Exam Vitals reviewed.  Constitutional:      General: She is not in acute distress.    Appearance: She is well-developed. She is not diaphoretic.  Eyes:     General: No scleral icterus. Pulmonary:     Effort: Pulmonary effort is normal. No respiratory distress.  Abdominal:     General: There is no distension.     Palpations: Abdomen is soft.     Tenderness: There is no abdominal tenderness. There is no guarding or rebound.  Skin:    General: Skin is warm and dry.  Neurological:     Mental Status: She is alert.     Coordination: Coordination normal.      Cervical Exam    Bedside Ultrasound Pt informed that the ultrasound is considered a limited OB ultrasound and is not intended to be a complete ultrasound exam.  Patient also informed that the ultrasound is not being completed with the intent of assessing for fetal or placental anomalies or any pelvic abnormalities.  Explained that the purpose of today's ultrasound is to assess for  viability.  Patient acknowledges the purpose of the exam and the limitations of the study.    My interpretation: Viable IUP, fetal heart rate 162 bpm, subjectively normal fluid, crown-rump length measuring about 9 weeks 5 days on multiple measurements   Labs Results for orders placed or performed during the hospital encounter of 09/19/22 (from the past 24 hour(s))  Pregnancy, urine POC     Status: Abnormal   Collection Time: 09/19/22 12:58 PM  Result Value Ref Range   Preg Test, Ur POSITIVE (A) NEGATIVE    Imaging No results found.  MAU Course  Procedures Lab Orders         Urinalysis, Routine w reflex microscopic Urine, Clean Catch         Pregnancy, urine  POC    No orders of the defined types were placed in this encounter.  Imaging Orders  No imaging studies  ordered today    MDM    Assessment and Plan  #Pelvic pain in pregnancy, first trimester #[redacted] weeks gestation of pregnancy Viable IUP seen on bedside ultrasound without any abnormal findings.  Reassured patient that cramping discomfort is a common symptom in early pregnancy especially with multipara's.  Routine return precautions.  #FWB 162 bpm by M mode on bedside US   Dispo: discharged to home in stable condition.    Clarnce Flock, MD/MPH 09/19/22 1:36 PM  Allergies as of 09/19/2022       Reactions   Shrimp Extract Allergy Skin Test         Medication List    You have not been prescribed any medications.

## 2022-10-03 ENCOUNTER — Ambulatory Visit (INDEPENDENT_AMBULATORY_CARE_PROVIDER_SITE_OTHER): Payer: Medicaid Other

## 2022-10-03 DIAGNOSIS — Z3481 Encounter for supervision of other normal pregnancy, first trimester: Secondary | ICD-10-CM | POA: Diagnosis not present

## 2022-10-03 DIAGNOSIS — Z3A11 11 weeks gestation of pregnancy: Secondary | ICD-10-CM | POA: Diagnosis not present

## 2022-10-03 DIAGNOSIS — Z3480 Encounter for supervision of other normal pregnancy, unspecified trimester: Secondary | ICD-10-CM

## 2022-10-03 MED ORDER — GOJJI WEIGHT SCALE MISC: 1.0000 | 0 refills | Status: DC

## 2022-10-03 MED ORDER — BLOOD PRESSURE KIT DEVI: 1.0000 | 0 refills | Status: DC

## 2022-10-03 NOTE — Progress Notes (Signed)
New OB Intake  I connected with Leah Price  on 10/03/22 at  3:10 PM EST by telephone Video Visit and verified that I am speaking with the correct person using two identifiers. Nurse is located at [facility] and pt is located at Home.  I discussed the limitations, risks, security and privacy concerns of performing an evaluation and management service by telephone and the availability of in person appointments. I also discussed with the patient that there may be a patient responsible charge related to this service. The patient expressed understanding and agreed to proceed.  I explained I am completing New OB Intake today. We discussed EDD of 04/20/23 that is based on first trimester 9 week u/s. Pt is G3/P2002. I reviewed her allergies, medications, Medical/Surgical/OB history, and appropriate screenings. I informed her of [facility] services. [facility] information placed in AVS. Based on history, this is a low risk pregnancy.  Patient Active Problem List   Diagnosis Date Noted   Chlamydia infection 06/14/2022   Abnormal uterine bleeding (AUB) 06/14/2022   Pelvic pain 06/14/2022    Concerns addressed today  Delivery Plans Plans to deliver at [facility]. Patient given information for [facility] Healthy Baby website for more information about Women[facility] and [place]. Patient is not interested in water birth. Offered upcoming OB visit with CNM to discuss further.  MyChart/Babyscripts MyChart access verified. I explained pt will have some visits in office and some virtually. Babyscripts instructions given and order placed. Patient verifies receipt of registration text/e-mail. Account successfully created and app downloaded.  Blood Pressure Cuff/Weight Scale Blood pressure cuff ordered for patient to pick-up from [employer]. Explained after first prenatal appt pt will check weekly and document in 59. Patient does not have weight scale; order sent to [facility], patient may track weight weekly  in Babyscripts.  Anatomy US Explained first scheduled [place] will be around 19 weeks. Anatomy US scheduled TBD.  Labs Discussed Johnsie Cancel genetic screening with patient. Would like both Panorama and Horizon drawn at new OB visit. Routine prenatal labs needed.  COVID Vaccine Patient has not had COVID vaccine.   Is patient a CenteringPregnancy candidate?  Declined Declined due to Group setting Not a candidate due to  If accepted,   Social Determinants of Health Food Insecurity: Patient denies food insecurity. WIC Referral: Patient is interested in referral to [facility].  Transportation: Patient denies transportation needs. Childcare: Discussed no children allowed at ultrasound appointments. Offered childcare services; patient declines childcare services at this time.  First visit review I reviewed new OB appt with patient. I explained they will have a provider visit that includes prenatal labs, pap smear, std screening, genetic screen, and discuss plan of care for pregnancy. Explained pt will be seen by Baltazar Najjar at first visit; encounter routed to appropriate provider. Explained that patient will be seen by pregnancy navigator following visit with provider.   Lucianne Lei, RN 10/03/2022  1:08 PM

## 2022-10-11 ENCOUNTER — Ambulatory Visit (INDEPENDENT_AMBULATORY_CARE_PROVIDER_SITE_OTHER): Payer: Medicaid Other | Admitting: Obstetrics

## 2022-10-11 ENCOUNTER — Other Ambulatory Visit (HOSPITAL_COMMUNITY)
Admission: RE | Admit: 2022-10-11 | Discharge: 2022-10-11 | Disposition: A | Discharge status: Home / Self Care | Payer: Medicaid Other | Source: Ambulatory Visit | Attending: Obstetrics | Admitting: Obstetrics

## 2022-10-11 ENCOUNTER — Encounter: Payer: Self-pay | Admitting: Obstetrics

## 2022-10-11 VITALS — BP 110/66 | HR 69 | Wt 124.4 lb

## 2022-10-11 DIAGNOSIS — Z3481 Encounter for supervision of other normal pregnancy, first trimester: Secondary | ICD-10-CM

## 2022-10-11 DIAGNOSIS — Z3480 Encounter for supervision of other normal pregnancy, unspecified trimester: Secondary | ICD-10-CM | POA: Diagnosis present

## 2022-10-11 DIAGNOSIS — Z3A12 12 weeks gestation of pregnancy: Secondary | ICD-10-CM | POA: Diagnosis not present

## 2022-10-11 MED ORDER — VITAFOL ULTRA 29-0.6-0.4-200 MG PO CAPS: 1.0000 | ORAL_CAPSULE | Freq: Every day | ORAL | 4 refills | Status: DC

## 2022-10-11 NOTE — Progress Notes (Signed)
Subjective:    Leah Price is being seen today for her first obstetrical visit.  This is not a planned pregnancy. She is at [redacted]w[redacted]d gestation. Her obstetrical history is significant for  none . Relationship with FOB: significant other, not living together. Patient does intend to breast feed. Pregnancy history fully reviewed.  The information documented in the HPI was reviewed and verified.  Menstrual History: OB History     Gravida  3   Para  2   Term  2   Preterm      AB      Living  2      SAB      IAB      Ectopic      Multiple  0   Live Births  2            Patient's last menstrual period was 07/04/2022.    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    Past Surgical History:  Procedure Laterality Date   NO PAST SURGERIES      (Not in a hospital admission)  Allergies  Allergen Reactions   Shrimp Extract Allergy Skin Test     Social History   Tobacco Use   Smoking status: Former    Passive exposure: Never   Smokeless tobacco: Never  Substance Use Topics   Alcohol use: Not Currently    Family History  Problem Relation Age of Onset   Hypertension Mother    Miscarriages / [place] Mother    Hypertension Maternal Grandmother    Hyperlipidemia Maternal Grandmother    ADD / ADHD Neg Hx    Alcohol abuse Neg Hx    Anxiety disorder Neg Hx    Arthritis Neg Hx    Asthma Neg Hx    Birth defects Neg Hx    Cancer Neg Hx    COPD Neg Hx    Depression Neg Hx    Diabetes Neg Hx    Drug abuse Neg Hx    Hearing loss Neg Hx    Heart disease Neg Hx    Intellectual disability Neg Hx    Kidney disease Neg Hx    Learning disabilities Neg Hx    Obesity Neg Hx    Stroke Neg Hx    Vision loss Neg Hx    Varicose Veins Neg Hx      Review of Systems Constitutional: negative for weight loss Gastrointestinal: negative for  vomiting Genitourinary:negative for genital lesions and vaginal discharge and dysuria Musculoskeletal:negative for back pain Behavioral/Psych: negative for abusive relationship, depression, illegal drug usage and tobacco use    Objective:    BP 110/66   Pulse 69   Wt 124 lb 6.4 oz (56.4 kg)   LMP 07/04/2022   BMI 20.70 kg/m  General Appearance:    Alert, cooperative, no distress, appears stated age  Head:    Normocephalic, without obvious abnormality, atraumatic  Eyes:    PERRL, conjunctiva/corneas clear, EOM's intact, fundi    benign, both eyes  Ears:    Normal TM's and external ear canals, both ears  Nose:   Nares normal, septum midline, mucosa normal, no drainage    or sinus tenderness  Throat:   Lips, mucosa, and tongue normal; teeth and gums normal  Neck:   Supple, symmetrical, trachea midline, no adenopathy;    thyroid:  no enlargement/tenderness/nodules; no carotid  bruit or JVD  Back:     Symmetric, no curvature, ROM normal, no CVA tenderness  Lungs:     Clear to auscultation bilaterally, respirations unlabored  Chest Wall:    No tenderness or deformity   Heart:    Regular rate and rhythm, S1 and S2 normal, no murmur, rub   or gallop  Breast Exam:    No tenderness, masses, or nipple abnormality  Abdomen:     Soft, non-tender, bowel sounds active all four quadrants,    no masses, no organomegaly  Genitalia:    Normal female without lesion, discharge or tenderness  Extremities:   Extremities normal, atraumatic, no cyanosis or edema  Pulses:   2+ and symmetric all extremities  Skin:   Skin color, texture, turgor normal, no rashes or lesions  Lymph nodes:   Cervical, supraclavicular, and axillary nodes normal  Neurologic:   CNII-XII intact, normal strength, sensation and reflexes    throughout      Lab Review Urine pregnancy test Labs reviewed yes Radiologic studies reviewed no  Assessment:    Pregnancy at [redacted]w[redacted]d weeks    Plan:      Prenatal vitamins.   Counseling provided regarding continued use of seat belts, cessation of alcohol consumption, smoking or use of illicit drugs; infection precautions i.e., influenza/TDAP immunizations, toxoplasmosis,CMV, parvovirus, listeria and varicella; workplace safety, exercise during pregnancy; routine dental care, safe medications, sexual activity, hot tubs, saunas, pools, travel, caffeine use, fish and methlymercury, potential toxins, hair treatments, varicose veins Weight gain recommendations per IOM guidelines reviewed: underweight/BMI< 18.5--> gain 28 - 40 lbs; normal weight/BMI 18.5 - 24.9--> gain 25 - 35 lbs; overweight/BMI 25 - 29.9--> gain 15 - 25 lbs; obese/BMI >30->gain  11 - 20 lbs Problem list reviewed and updated. FIRST/CF mutation testing/NIPT/QUAD SCREEN/fragile X/Ashkenazi Jewish population testing/Spinal muscular atrophy discussed: requested. Role of ultrasound in pregnancy discussed; fetal survey: requested. Amniocentesis discussed: not indicated.  Meds ordered this encounter  Medications   Prenat-Fe Poly-Methfol-FA-DHA (VITAFOL ULTRA) 29-0.6-0.4-200 MG CAPS    Sig: Take 1 capsule by mouth daily before breakfast.    Dispense:  90 capsule    Refill:  4   Orders Placed This Encounter  Procedures   Culture, OB Urine   [place] MFM OB COMP + 14 WK    Standing Status:   Future    Standing Expiration Date:   10/12/2023    Order Specific Question:   Reason for Exam (SYMPTOM  OR DIAGNOSIS REQUIRED)    Answer:   Anatomy    Order Specific Question:   Preferred Location    Answer:   [facility] Ultrasound   Panorama Prenatal Test Full Panel    ==========Department Information========== ID: [id number] Department:[facility] [address] [place], [place] [address] Dept: [phone] Dept Fax: [phone]     Order Specific Question:   Expected due date (MM/DD/YYYY):    Answer:   04/20/2023    Order  Specific Question:   Is this a twin pregnancy? (viable, no vanished twin)    Answer:   No    Order Specific Question:   Is this a surrogate or egg donor pregnancy?    Answer:   No    Order Specific Question:   I want fetal sex included in the report:    Answer:   Yes    Order Specific Question:  Maternal Weight (lbs):    Answer:   77    Order Specific Question:   Which Microdeletion Panel should be ordered?    Answer:   22q11.2 Deletion    Order Specific Question:   What type of billing?    Answer:   Programme researcher, broadcasting/film/video Question:   By placing this electronic order I confirm the testing ordered herein is medically necessary and this patient has been informed of the details of the genetic test(s) ordered, including the risks, benefits, and alternatives, and has consented to testing.    Answer:   Yes    Order Specific Question:   Select an order diagnosis: For additional options refer to [email or web address]    Answer:   Encounter for supervision of other normal pregnancy in first trimester [[id number]]   CBC/D/Plt+RPR+Rh+ABO+RubIgG...    Follow up in 4 weeks.  I have spent a total of 25 minutes of face-to-face time, excluding clinical staff time, reviewing notes and preparing to see patient, ordering tests and/or medications, and counseling the patient.   Shelly Bombard, MD 10/11/2022 8:56 AM

## 2022-10-11 NOTE — Progress Notes (Signed)
Pt presents for new OB visit. No concerns at this time.  

## 2022-10-12 ENCOUNTER — Other Ambulatory Visit: Payer: Self-pay | Admitting: Obstetrics

## 2022-10-12 DIAGNOSIS — O99019 Anemia complicating pregnancy, unspecified trimester: Secondary | ICD-10-CM

## 2022-10-12 LAB — CBC/D/PLT+RPR+RH+ABO+RUBIGG...
Antibody Screen: NEGATIVE
Basophils Absolute: 0 10*3/uL (ref 0.0–0.2)
Basos: 0 %
EOS (ABSOLUTE): 0.3 10*3/uL (ref 0.0–0.4)
Eos: 3 %
HCV Ab: NONREACTIVE
HIV Screen 4th Generation wRfx: NONREACTIVE
Hematocrit: 33.9 % — ABNORMAL LOW (ref 34.0–46.6)
Hemoglobin: 10.9 g/dL — ABNORMAL LOW (ref 11.1–15.9)
Hepatitis B Surface Ag: NEGATIVE
Immature Grans (Abs): 0 10*3/uL (ref 0.0–0.1)
Immature Granulocytes: 0 %
Lymphocytes Absolute: 1.9 10*3/uL (ref 0.7–3.1)
Lymphs: 22 %
MCH: 24.7 pg — ABNORMAL LOW (ref 26.6–33.0)
MCHC: 32.2 g/dL (ref 31.5–35.7)
MCV: 77 fL — ABNORMAL LOW (ref 79–97)
Monocytes Absolute: 0.7 10*3/uL (ref 0.1–0.9)
Monocytes: 8 %
Neutrophils Absolute: 5.8 10*3/uL (ref 1.4–7.0)
Neutrophils: 67 %
Platelets: 363 10*3/uL (ref 150–450)
RBC: 4.41 x10E6/uL (ref 3.77–5.28)
RDW: 17.2 % — ABNORMAL HIGH (ref 11.7–15.4)
RPR Ser Ql: NONREACTIVE
Rh Factor: POSITIVE
Rubella Antibodies, IGG: <0.9 index — ABNORMAL LOW (ref >0.99)
WBC: 8.7 10*3/uL (ref 3.4–10.8)

## 2022-10-12 LAB — CERVICOVAGINAL ANCILLARY ONLY
Chlamydia: NEGATIVE
Comment: NEGATIVE
Comment: NEGATIVE
Comment: NORMAL
Neisseria Gonorrhea: NEGATIVE
Trichomonas: NEGATIVE

## 2022-10-12 LAB — HCV INTERPRETATION

## 2022-10-12 MED ORDER — ACCRUFER 30 MG PO CAPS: 1.0000 | ORAL_CAPSULE | Freq: Two times a day (BID) | ORAL | 3 refills | Status: DC

## 2022-10-16 ENCOUNTER — Other Ambulatory Visit: Payer: Self-pay | Admitting: Obstetrics

## 2022-10-16 ENCOUNTER — Other Ambulatory Visit: Payer: Self-pay | Admitting: *Deleted

## 2022-10-16 DIAGNOSIS — Z3480 Encounter for supervision of other normal pregnancy, unspecified trimester: Secondary | ICD-10-CM

## 2022-10-16 DIAGNOSIS — O2340 Unspecified infection of urinary tract in pregnancy, unspecified trimester: Secondary | ICD-10-CM

## 2022-10-16 DIAGNOSIS — O219 Vomiting of pregnancy, unspecified: Secondary | ICD-10-CM

## 2022-10-16 LAB — PANORAMA PRENATAL TEST FULL PANEL:PANORAMA TEST PLUS 5 ADDITIONAL MICRODELETIONS: FETAL FRACTION: 9.1

## 2022-10-16 LAB — CULTURE, OB URINE

## 2022-10-16 LAB — URINE CULTURE, OB REFLEX

## 2022-10-16 MED ORDER — CEFUROXIME AXETIL 500 MG PO TABS: 500.0000 mg | ORAL_TABLET | Freq: Two times a day (BID) | ORAL | 0 refills | Status: DC

## 2022-10-16 MED ORDER — PROMETHAZINE HCL 25 MG PO TABS: 25.0000 mg | ORAL_TABLET | Freq: Four times a day (QID) | ORAL | 1 refills | Status: DC | PRN

## 2022-10-16 MED ORDER — DOXYLAMINE-PYRIDOXINE 10-10 MG PO TBEC: 2.0000 | DELAYED_RELEASE_TABLET | Freq: Every day | ORAL | 5 refills | Status: DC

## 2022-10-16 NOTE — Progress Notes (Signed)
TC to notify pt of GBS in urine and Ceftin RX. Pt verbalized understanding. All questions answered. Education sent via [place].

## 2022-10-16 NOTE — Progress Notes (Signed)
Pt reports episodes of dizziness with flushing or "feeling hot". Pt is 13.[redacted] wks GA. Endorses poor appetite and nausea without recent vomiting. Advised of early pregnancy changes that can lead to dizziness. Advised hydration and eating every 2 hours with a night time snack and AM cracker before getting out of bed. RX phenergan and diclegis sent per protocol. Instructions on use given. Advised to seek care in MAU if symptoms do not resolve with improved hydration and food intake.

## 2022-10-22 LAB — CYTOLOGY - PAP
Comment: NEGATIVE
Diagnosis: UNDETERMINED — AB
High risk HPV: NEGATIVE

## 2022-11-01 ENCOUNTER — Other Ambulatory Visit (HOSPITAL_COMMUNITY)
Admission: RE | Admit: 2022-11-01 | Discharge: 2022-11-01 | Disposition: A | Discharge status: Home / Self Care | Payer: Medicaid Other | Source: Ambulatory Visit | Attending: Obstetrics | Admitting: Obstetrics

## 2022-11-01 ENCOUNTER — Ambulatory Visit (INDEPENDENT_AMBULATORY_CARE_PROVIDER_SITE_OTHER): Payer: Medicaid Other | Admitting: Obstetrics

## 2022-11-01 ENCOUNTER — Encounter: Payer: Self-pay | Admitting: Obstetrics

## 2022-11-01 VITALS — BP 101/68

## 2022-11-01 DIAGNOSIS — O2340 Unspecified infection of urinary tract in pregnancy, unspecified trimester: Secondary | ICD-10-CM

## 2022-11-01 DIAGNOSIS — N898 Other specified noninflammatory disorders of vagina: Secondary | ICD-10-CM | POA: Diagnosis present

## 2022-11-01 DIAGNOSIS — Z3A15 15 weeks gestation of pregnancy: Secondary | ICD-10-CM

## 2022-11-01 DIAGNOSIS — R300 Dysuria: Secondary | ICD-10-CM

## 2022-11-01 LAB — POCT URINALYSIS DIPSTICK
Bilirubin, UA: 2
Blood, UA: NEGATIVE
Glucose, UA: NEGATIVE
Ketones, UA: NEGATIVE
Nitrite, UA: POSITIVE
Protein, UA: NEGATIVE
Spec Grav, UA: 1.015 (ref 1.010–1.025)
Urobilinogen, UA: 0.2 E.U./dL
pH, UA: 6.5 (ref 5.0–8.0)

## 2022-11-01 MED ORDER — CEFUROXIME AXETIL 500 MG PO TABS: 500.0000 mg | ORAL_TABLET | Freq: Two times a day (BID) | ORAL | 0 refills | Status: DC

## 2022-11-01 MED ORDER — TERCONAZOLE 0.4 % VA CREA: 1.0000 | TOPICAL_CREAM | Freq: Every day | VAGINAL | 0 refills | Status: DC

## 2022-11-01 NOTE — Progress Notes (Signed)
Patient presents with vaginal irritation x3 days. Has hx of recurrent yeast and UTI in previous pregnancies.

## 2022-11-01 NOTE — Progress Notes (Signed)
Subjective:  Leah Price is a 22 y.o. G3P2002 at 49w5dbeing seen today for ongoing prenatal care.  She is currently monitored for the following issues for this low-risk pregnancy and has Chlamydia infection; Abnormal uterine bleeding (AUB); Pelvic pain; and Supervision of other normal pregnancy, antepartum on their problem list.  Patient reports vaginal irritation and pain with urination .  Contractions: Not present. Vag. Bleeding: None.  Movement: Present. Denies leaking of fluid.   The following portions of the patient's history were reviewed and updated as appropriate: allergies, current medications, past family history, past medical history, past social history, past surgical history and problem list. Problem list updated.  Objective:   Vitals:   11/01/22 1335  BP: 101/68    Fetal Status: Fetal Heart Rate (bpm): 147   Movement: Present     General:  Alert, oriented and cooperative. Patient is in no acute distress.  Skin: Skin is warm and dry. No rash noted.   Cardiovascular: Normal heart rate noted  Respiratory: Normal respiratory effort, no problems with respiration noted  Abdomen: Soft, gravid, appropriate for gestational age. Pain/Pressure: Absent     Pelvic:  Cervical exam deferred        Extremities: Normal range of motion.  Edema: None  Mental Status: Normal mood and affect. Normal behavior. Normal judgment and thought content.   Urinalysis:      Assessment and Plan:  Pregnancy: G3P2002 at 131w5d1. Dysuria Rx: - POCT urinalysis dipstick - Culture, OB Urine  2. Vaginal discharge Rx: - Cervicovaginal ancillary only( [place]) - terconazole (TERAZOL 7) 0.4 % vaginal cream; Place 1 applicator vaginally at bedtime.  Dispense: 45 g; Refill: 0  3. Urinary tract infection in mother during pregnancy, antepartum Rx: - cefUROXime (CEFTIN) 500 MG tablet; Take 1 tablet (500 mg total) by mouth 2 (two) times daily with a meal.  Dispense: 14 tablet; Refill: 0   Preterm  labor symptoms and general obstetric precautions including but not limited to vaginal bleeding, contractions, leaking of fluid and fetal movement were reviewed in detail with the patient. Please refer to After Visit Summary for other counseling recommendations.   Return in about 4 weeks (around 11/29/2022) for ROB.   HaShelly BombardMD 11/01/22

## 2022-11-02 ENCOUNTER — Other Ambulatory Visit: Payer: Self-pay | Admitting: Obstetrics

## 2022-11-02 LAB — CERVICOVAGINAL ANCILLARY ONLY
Bacterial Vaginitis (gardnerella): NEGATIVE
Candida Glabrata: POSITIVE — AB
Candida Vaginitis: NEGATIVE
Chlamydia: NEGATIVE
Comment: NEGATIVE
Comment: NEGATIVE
Comment: NEGATIVE
Comment: NEGATIVE
Comment: NEGATIVE
Comment: NORMAL
Neisseria Gonorrhea: NEGATIVE
Trichomonas: NEGATIVE

## 2022-11-03 LAB — CULTURE, OB URINE

## 2022-11-03 LAB — URINE CULTURE, OB REFLEX

## 2022-11-08 ENCOUNTER — Encounter: Payer: Medicaid Other | Admitting: Obstetrics and Gynecology

## 2022-11-22 ENCOUNTER — Other Ambulatory Visit: Payer: Self-pay | Admitting: *Deleted

## 2022-11-22 DIAGNOSIS — Z3480 Encounter for supervision of other normal pregnancy, unspecified trimester: Secondary | ICD-10-CM

## 2022-11-22 NOTE — Progress Notes (Signed)
U/s order change to detail per MFM

## 2022-11-27 ENCOUNTER — Ambulatory Visit: Payer: Medicaid Other | Admitting: *Deleted

## 2022-11-27 ENCOUNTER — Other Ambulatory Visit: Payer: Self-pay | Admitting: *Deleted

## 2022-11-27 ENCOUNTER — Encounter: Payer: Self-pay | Admitting: *Deleted

## 2022-11-27 ENCOUNTER — Ambulatory Visit: Payer: Medicaid Other | Attending: Obstetrics

## 2022-11-27 VITALS — BP 107/69 | HR 85

## 2022-11-27 DIAGNOSIS — Z3A19 19 weeks gestation of pregnancy: Secondary | ICD-10-CM | POA: Insufficient documentation

## 2022-11-27 DIAGNOSIS — Z363 Encounter for antenatal screening for malformations: Secondary | ICD-10-CM | POA: Diagnosis not present

## 2022-11-27 DIAGNOSIS — Z8759 Personal history of other complications of pregnancy, childbirth and the puerperium: Secondary | ICD-10-CM

## 2022-11-27 DIAGNOSIS — Z3480 Encounter for supervision of other normal pregnancy, unspecified trimester: Secondary | ICD-10-CM | POA: Diagnosis present

## 2022-11-27 DIAGNOSIS — O09292 Supervision of pregnancy with other poor reproductive or obstetric history, second trimester: Secondary | ICD-10-CM | POA: Diagnosis not present

## 2022-11-27 DIAGNOSIS — O283 Abnormal ultrasonic finding on antenatal screening of mother: Secondary | ICD-10-CM

## 2022-11-29 ENCOUNTER — Ambulatory Visit (INDEPENDENT_AMBULATORY_CARE_PROVIDER_SITE_OTHER): Payer: Medicaid Other | Admitting: Family Medicine

## 2022-11-29 ENCOUNTER — Encounter: Payer: Self-pay | Admitting: Family Medicine

## 2022-11-29 VITALS — BP 123/70 | HR 81 | Wt 124.8 lb

## 2022-11-29 DIAGNOSIS — O283 Abnormal ultrasonic finding on antenatal screening of mother: Secondary | ICD-10-CM

## 2022-11-29 DIAGNOSIS — Z3480 Encounter for supervision of other normal pregnancy, unspecified trimester: Secondary | ICD-10-CM

## 2022-11-29 DIAGNOSIS — Z3A19 19 weeks gestation of pregnancy: Secondary | ICD-10-CM

## 2022-11-29 NOTE — Progress Notes (Signed)
   PRENATAL VISIT NOTE  Subjective:  Leah Price is a 22 y.o. G3P2002 at [redacted]w[redacted]d being seen today for ongoing prenatal care.  She is currently monitored for the following issues for this low-risk pregnancy and has Chlamydia infection; Abnormal uterine bleeding (AUB); Pelvic pain; Supervision of other normal pregnancy, antepartum; and Echogenic intracardiac focus of fetus on prenatal ultrasound on their problem list.  Patient reports  intermittent cramping, mostly after walking for long periods .  Contractions: Not present. Vag. Bleeding: None.  Movement: Present. Denies leaking of fluid.   The following portions of the patient's history were reviewed and updated as appropriate: allergies, current medications, past family history, past medical history, past social history, past surgical history and problem list.   Objective:   Vitals:   11/29/22 1439  BP: 123/70  Pulse: 81  Weight: 124 lb 12.8 oz (56.6 kg)    Fetal Status: Fetal Heart Rate (bpm): 146 Fundal Height: 21 cm Movement: Present     General:  Alert, oriented and cooperative. Patient is in no acute distress.  Skin: Skin is warm and dry. No rash noted.   Cardiovascular: Normal heart rate noted  Respiratory: Normal respiratory effort, no problems with respiration noted  Abdomen: Soft, gravid, appropriate for gestational age.  Pain/Pressure: Present     Pelvic: Cervical exam deferred        Extremities: Normal range of motion.  Edema: None  Mental Status: Normal mood and affect. Normal behavior. Normal judgment and thought content.   Assessment and Plan:  Pregnancy: G3P2002 at [redacted]w[redacted]d 1. Supervision of other normal pregnancy, antepartum 2. [redacted] weeks gestation of pregnancy Doing well overall. Anatomy scan reassuring, fetus is 17 percentile. Did note EICF, but this unlikely related to Trisomy 21, negative NIPS -Serum AFP ordered today. - follow up US planned in 8 weeks due to history of IUGR in previous pregnancy. -Encouraged  take intermittent rest, as well as keeping well-hydrated and to limit cramping.  However helpful to continue keeping physically active.  Preterm labor symptoms and general obstetric precautions including but not limited to vaginal bleeding, contractions, leaking of fluid and fetal movement were reviewed in detail with the patient. Please refer to After Visit Summary for other counseling recommendations.   Return in about 4 weeks (around 12/27/2022) for lob.  Future Appointments  Date Time Provider [place]  12/27/2022  2:30 PM Chancy Milroy, MD [facility] None  01/22/2023  2:15 PM [facility] NURSE [facility]  01/22/2023  2:30 PM [facility] US2 [facility] [place]    Liliane Channel MD MPH OB Fellow, [place] for [place] 11/29/2022

## 2022-11-29 NOTE — Progress Notes (Signed)
Pt presents for ROB visit. No concerns at this time.  

## 2022-12-01 LAB — AFP, SERUM, OPEN SPINA BIFIDA
AFP MoM: 1.29
AFP Value: 74.2 ng/mL
Gest. Age on Collection Date: 19 weeks
Maternal Age At EDD: 23.8 yr
OSBR Risk 1 IN: 4947
Test Results:: NEGATIVE
Weight: 124 [lb_av]

## 2022-12-19 ENCOUNTER — Ambulatory Visit (INDEPENDENT_AMBULATORY_CARE_PROVIDER_SITE_OTHER): Payer: Medicaid Other | Admitting: Obstetrics

## 2022-12-19 ENCOUNTER — Encounter: Payer: Self-pay | Admitting: Obstetrics

## 2022-12-19 VITALS — BP 101/68 | HR 93

## 2022-12-19 DIAGNOSIS — J301 Allergic rhinitis due to pollen: Secondary | ICD-10-CM

## 2022-12-19 DIAGNOSIS — Z3480 Encounter for supervision of other normal pregnancy, unspecified trimester: Secondary | ICD-10-CM

## 2022-12-19 DIAGNOSIS — O2242 Hemorrhoids in pregnancy, second trimester: Secondary | ICD-10-CM

## 2022-12-19 DIAGNOSIS — K5901 Slow transit constipation: Secondary | ICD-10-CM

## 2022-12-19 MED ORDER — POLYETHYLENE GLYCOL 3350 17 G PO PACK: 17.0000 g | PACK | Freq: Every day | ORAL | 6 refills | Status: DC

## 2022-12-19 MED ORDER — DOCUSATE SODIUM 100 MG PO CAPS: 100.0000 mg | ORAL_CAPSULE | Freq: Two times a day (BID) | ORAL | 11 refills | Status: DC

## 2022-12-19 MED ORDER — CETIRIZINE HCL 10 MG PO TABS: 10.0000 mg | ORAL_TABLET | Freq: Every day | ORAL | 11 refills | Status: DC

## 2022-12-19 NOTE — Progress Notes (Signed)
Leah Price, c/o constipation, cramping on her left side and blood in her stool.

## 2022-12-19 NOTE — Progress Notes (Signed)
Subjective:  Leah Price is a 22 y.o. G3P2002 at [redacted]w[redacted]d being seen today for ongoing prenatal care.  She is currently monitored for the following issues for this low-risk pregnancy and has Chlamydia infection; Abnormal uterine bleeding (AUB); Pelvic pain; Supervision of other normal pregnancy, antepartum; and Echogenic intracardiac focus of fetus on prenatal ultrasound on their problem list.  Patient reports  constipation .  Contractions: Not present. Vag. Bleeding: None.  Movement: Present. Denies leaking of fluid.   The following portions of the patient's history were reviewed and updated as appropriate: allergies, current medications, past family history, past medical history, past social history, past surgical history and problem list. Problem list updated.  Objective:   Vitals:   12/19/22 1158  BP: 101/68  Pulse: 93    Fetal Status: Fetal Heart Rate (bpm): 147   Movement: Present     General:  Alert, oriented and cooperative. Patient is in no acute distress.  Skin: Skin is warm and dry. No rash noted.   Cardiovascular: Normal heart rate noted  Respiratory: Normal respiratory effort, no problems with respiration noted  Abdomen: Soft, gravid, appropriate for gestational age. Pain/Pressure: Present     Pelvic:  Cervical exam deferred        Extremities: Normal range of motion.  Edema: None  Mental Status: Normal mood and affect. Normal behavior. Normal judgment and thought content.   Urinalysis:      Assessment and Plan:  Pregnancy: G3P2002 at [redacted]w[redacted]d  There are no diagnoses linked to this encounter. Preterm labor symptoms and general obstetric precautions including but not limited to vaginal bleeding, contractions, leaking of fluid and fetal movement were reviewed in detail with the patient. Please refer to After Visit Summary for other counseling recommendations.   Return in about 4 weeks (around 01/16/2023) for ROB, 2 hour OGTT.   Brock Bad, MD 12/19/22

## 2022-12-27 ENCOUNTER — Encounter: Payer: Self-pay | Admitting: Obstetrics and Gynecology

## 2022-12-27 ENCOUNTER — Ambulatory Visit (INDEPENDENT_AMBULATORY_CARE_PROVIDER_SITE_OTHER): Payer: Medicaid Other | Admitting: Obstetrics and Gynecology

## 2022-12-27 VITALS — BP 102/70 | HR 106 | Wt 133.0 lb

## 2022-12-27 DIAGNOSIS — Z3480 Encounter for supervision of other normal pregnancy, unspecified trimester: Secondary | ICD-10-CM

## 2022-12-27 DIAGNOSIS — Z8759 Personal history of other complications of pregnancy, childbirth and the puerperium: Secondary | ICD-10-CM

## 2022-12-27 DIAGNOSIS — O283 Abnormal ultrasonic finding on antenatal screening of mother: Secondary | ICD-10-CM

## 2022-12-27 NOTE — Progress Notes (Signed)
Subjective:  Leah Price is a 22 y.o. G3P2002 at [redacted]w[redacted]d being seen today for ongoing prenatal care.  She is currently monitored for the following issues for this low-risk pregnancy and has History of prior pregnancy with IUGR newborn; Supervision of other normal pregnancy, antepartum; and Echogenic intracardiac focus of fetus on prenatal ultrasound on their problem list.  Patient reports no complaints.  Contractions: Not present. Vag. Bleeding: None.  Movement: Present. Denies leaking of fluid.   The following portions of the patient's history were reviewed and updated as appropriate: allergies, current medications, past family history, past medical history, past social history, past surgical history and problem list. Problem list updated.  Objective:   Vitals:   12/27/22 1435  BP: 102/70  Pulse: (!) 106  Weight: 133 lb (60.3 kg)    Fetal Status: Fetal Heart Rate (bpm): 155   Movement: Present     General:  Alert, oriented and cooperative. Patient is in no acute distress.  Skin: Skin is warm and dry. No rash noted.   Cardiovascular: Normal heart rate noted  Respiratory: Normal respiratory effort, no problems with respiration noted  Abdomen: Soft, gravid, appropriate for gestational age. Pain/Pressure: Absent     Pelvic:  Cervical exam deferred        Extremities: Normal range of motion.  Edema: None  Mental Status: Normal mood and affect. Normal behavior. Normal judgment and thought content.   Urinalysis:      Assessment and Plan:  Pregnancy: G3P2002 at [redacted]w[redacted]d  1. Supervision of other normal pregnancy, antepartum Stable Glucola next visit  2. Echogenic intracardiac focus of fetus on prenatal ultrasound Normal genetic testing Declined Amnio F/U growth scan scheduled  3. History of prior pregnancy with IUGR newborn Growth scan scheduled  Preterm labor symptoms and general obstetric precautions including but not limited to vaginal bleeding, contractions, leaking of fluid and  fetal movement were reviewed in detail with the patient. Please refer to After Visit Summary for other counseling recommendations.  Return in about 4 weeks (around 01/24/2023) for OB visit, face to face, any provider, fasting for Glucola.   Hermina Staggers, MD

## 2023-01-21 DIAGNOSIS — O365990 Maternal care for other known or suspected poor fetal growth, unspecified trimester, not applicable or unspecified: Secondary | ICD-10-CM | POA: Insufficient documentation

## 2023-01-22 ENCOUNTER — Ambulatory Visit: Payer: Medicaid Other | Attending: Maternal & Fetal Medicine

## 2023-01-22 ENCOUNTER — Other Ambulatory Visit: Payer: Self-pay | Admitting: *Deleted

## 2023-01-22 ENCOUNTER — Ambulatory Visit: Payer: Medicaid Other | Admitting: *Deleted

## 2023-01-22 ENCOUNTER — Encounter: Payer: Self-pay | Admitting: *Deleted

## 2023-01-22 VITALS — BP 109/58 | HR 91

## 2023-01-22 DIAGNOSIS — O358XX0 Maternal care for other (suspected) fetal abnormality and damage, not applicable or unspecified: Secondary | ICD-10-CM | POA: Diagnosis present

## 2023-01-22 DIAGNOSIS — O09292 Supervision of pregnancy with other poor reproductive or obstetric history, second trimester: Secondary | ICD-10-CM | POA: Diagnosis not present

## 2023-01-22 DIAGNOSIS — O285 Abnormal chromosomal and genetic finding on antenatal screening of mother: Secondary | ICD-10-CM | POA: Diagnosis not present

## 2023-01-22 DIAGNOSIS — O321XX0 Maternal care for breech presentation, not applicable or unspecified: Secondary | ICD-10-CM | POA: Insufficient documentation

## 2023-01-22 DIAGNOSIS — O365990 Maternal care for other known or suspected poor fetal growth, unspecified trimester, not applicable or unspecified: Secondary | ICD-10-CM

## 2023-01-22 DIAGNOSIS — O09293 Supervision of pregnancy with other poor reproductive or obstetric history, third trimester: Secondary | ICD-10-CM | POA: Diagnosis not present

## 2023-01-22 DIAGNOSIS — D563 Thalassemia minor: Secondary | ICD-10-CM

## 2023-01-22 DIAGNOSIS — Z148 Genetic carrier of other disease: Secondary | ICD-10-CM | POA: Diagnosis not present

## 2023-01-22 DIAGNOSIS — Z3A27 27 weeks gestation of pregnancy: Secondary | ICD-10-CM | POA: Insufficient documentation

## 2023-01-22 DIAGNOSIS — Z3689 Encounter for other specified antenatal screening: Secondary | ICD-10-CM

## 2023-01-22 DIAGNOSIS — O283 Abnormal ultrasonic finding on antenatal screening of mother: Secondary | ICD-10-CM

## 2023-01-22 DIAGNOSIS — O35BXX0 Maternal care for other (suspected) fetal abnormality and damage, fetal cardiac anomalies, not applicable or unspecified: Secondary | ICD-10-CM

## 2023-01-22 DIAGNOSIS — Z3480 Encounter for supervision of other normal pregnancy, unspecified trimester: Secondary | ICD-10-CM

## 2023-01-22 DIAGNOSIS — Z8759 Personal history of other complications of pregnancy, childbirth and the puerperium: Secondary | ICD-10-CM

## 2023-01-25 ENCOUNTER — Ambulatory Visit (INDEPENDENT_AMBULATORY_CARE_PROVIDER_SITE_OTHER): Payer: Medicaid Other | Admitting: Obstetrics and Gynecology

## 2023-01-25 ENCOUNTER — Other Ambulatory Visit: Payer: Medicaid Other

## 2023-01-25 ENCOUNTER — Encounter: Payer: Self-pay | Admitting: Obstetrics and Gynecology

## 2023-01-25 VITALS — BP 104/70 | HR 86 | Wt 139.0 lb

## 2023-01-25 DIAGNOSIS — Z8759 Personal history of other complications of pregnancy, childbirth and the puerperium: Secondary | ICD-10-CM

## 2023-01-25 DIAGNOSIS — O283 Abnormal ultrasonic finding on antenatal screening of mother: Secondary | ICD-10-CM

## 2023-01-25 DIAGNOSIS — Z3A27 27 weeks gestation of pregnancy: Secondary | ICD-10-CM | POA: Diagnosis not present

## 2023-01-25 DIAGNOSIS — Z3480 Encounter for supervision of other normal pregnancy, unspecified trimester: Secondary | ICD-10-CM

## 2023-01-25 NOTE — Progress Notes (Signed)
Subjective:  Leah Price is a 22 y.o. G3P2002 at [redacted]w[redacted]d being seen today for ongoing prenatal care.  She is currently monitored for the following issues for this low-risk pregnancy and has History of prior pregnancy with IUGR newborn; Supervision of other normal pregnancy, antepartum; and Echogenic intracardiac focus of fetus on prenatal ultrasound on their problem list.  Patient reports  general discomforts of pregnancy .  Contractions: Not present. Vag. Bleeding: None.  Movement: Present. Denies leaking of fluid.   The following portions of the patient's history were reviewed and updated as appropriate: allergies, current medications, past family history, past medical history, past social history, past surgical history and problem list. Problem list updated.  Objective:   Vitals:   01/25/23 0822  BP: 104/70  Pulse: 86  Weight: 139 lb (63 kg)    Fetal Status: Fetal Heart Rate (bpm): 140   Movement: Present     General:  Alert, oriented and cooperative. Patient is in no acute distress.  Skin: Skin is warm and dry. No rash noted.   Cardiovascular: Normal heart rate noted  Respiratory: Normal respiratory effort, no problems with respiration noted  Abdomen: Soft, gravid, appropriate for gestational age. Pain/Pressure: Absent     Pelvic:  Cervical exam deferred        Extremities: Normal range of motion.     Mental Status: Normal mood and affect. Normal behavior. Normal judgment and thought content.   Urinalysis:      Assessment and Plan:  Pregnancy: G3P2002 at [redacted]w[redacted]d  1. Supervision of other normal pregnancy, antepartum Stable - Glucose Tolerance, 2 Hours w/1 Hour - RPR - CBC - HIV antibody (with reflex)  2. Echogenic intracardiac focus of fetus on prenatal ultrasound Declined Amino, serial growth scans as per MFM  3. History of prior pregnancy with IUGR newborn Nl interval growth on last scan. Continue with serial growth scans as per MFM  Preterm labor symptoms and general  obstetric precautions including but not limited to vaginal bleeding, contractions, leaking of fluid and fetal movement were reviewed in detail with the patient. Please refer to After Visit Summary for other counseling recommendations.  Return in about 2 weeks (around 02/08/2023) for OB visit, face to face, any provider.   Hermina Staggers, MD

## 2023-01-25 NOTE — Progress Notes (Signed)
Pt has pain at [employer] area when coughing.

## 2023-01-26 LAB — CBC
Hematocrit: 31.1 % — ABNORMAL LOW (ref 34.0–46.6)
Hemoglobin: 9.5 g/dL — ABNORMAL LOW (ref 11.1–15.9)
MCH: 24.2 pg — ABNORMAL LOW (ref 26.6–33.0)
MCHC: 30.5 g/dL — ABNORMAL LOW (ref 31.5–35.7)
MCV: 79 fL (ref 79–97)
Platelets: 453 10*3/uL — ABNORMAL HIGH (ref 150–450)
RBC: 3.93 x10E6/uL (ref 3.77–5.28)
RDW: 13.4 % (ref 11.7–15.4)
WBC: 12.7 10*3/uL — ABNORMAL HIGH (ref 3.4–10.8)

## 2023-01-26 LAB — GLUCOSE TOLERANCE, 2 HOURS W/ 1HR
Glucose, 1 hour: 158 mg/dL (ref 70–179)
Glucose, 2 hour: 105 mg/dL (ref 70–152)
Glucose, Fasting: 87 mg/dL (ref 70–91)

## 2023-01-26 LAB — RPR: RPR Ser Ql: NONREACTIVE

## 2023-01-26 LAB — HIV ANTIBODY (ROUTINE TESTING W REFLEX): HIV Screen 4th Generation wRfx: NONREACTIVE

## 2023-02-08 ENCOUNTER — Encounter: Payer: Self-pay | Admitting: Obstetrics

## 2023-02-08 ENCOUNTER — Ambulatory Visit (INDEPENDENT_AMBULATORY_CARE_PROVIDER_SITE_OTHER): Payer: Medicaid Other | Admitting: Obstetrics

## 2023-02-08 VITALS — BP 104/72 | HR 81 | Wt 134.0 lb

## 2023-02-08 DIAGNOSIS — Z8759 Personal history of other complications of pregnancy, childbirth and the puerperium: Secondary | ICD-10-CM

## 2023-02-08 DIAGNOSIS — O283 Abnormal ultrasonic finding on antenatal screening of mother: Secondary | ICD-10-CM

## 2023-02-08 DIAGNOSIS — O0990 Supervision of high risk pregnancy, unspecified, unspecified trimester: Secondary | ICD-10-CM

## 2023-02-08 DIAGNOSIS — O2242 Hemorrhoids in pregnancy, second trimester: Secondary | ICD-10-CM

## 2023-02-08 NOTE — Progress Notes (Signed)
Subjective:  Leah Price is a 22 y.o. G3P2002 at [redacted]w[redacted]d being seen today for ongoing prenatal care.  She is currently monitored for the following issues for this low-risk pregnancy and has History of prior pregnancy with IUGR newborn; Supervision of other normal pregnancy, antepartum; and Echogenic intracardiac focus of fetus on prenatal ultrasound on their problem list.  Patient reports no complaints.  Contractions: Not present. Vag. Bleeding: None.  Movement: Present. Denies leaking of fluid.   The following portions of the patient's history were reviewed and updated as appropriate: allergies, current medications, past family history, past medical history, past social history, past surgical history and problem list. Problem list updated.  Objective:   Vitals:   02/08/23 1016  BP: 104/72  Pulse: 81  Weight: 134 lb (60.8 kg)    Fetal Status: Fetal Heart Rate (bpm): 138   Movement: Present     General:  Alert, oriented and cooperative. Patient is in no acute distress.  Skin: Skin is warm and dry. No rash noted.   Cardiovascular: Normal heart rate noted  Respiratory: Normal respiratory effort, no problems with respiration noted  Abdomen: Soft, gravid, appropriate for gestational age. Pain/Pressure: Present     Pelvic:  Cervical exam deferred        Extremities: Normal range of motion.  Edema: None  Mental Status: Normal mood and affect. Normal behavior. Normal judgment and thought content.   Urinalysis:      Assessment and Plan:  Pregnancy: G3P2002 at [redacted]w[redacted]d  1. Supervision of high risk pregnancy, antepartum  2. History of prior pregnancy with IUGR newborn  3. Echogenic intracardiac focus of fetus on prenatal ultrasound  4. Hemorrhoids during pregnancy in second trimester    Preterm labor symptoms and general obstetric precautions including but not limited to vaginal bleeding, contractions, leaking of fluid and fetal movement were reviewed in detail with the patient. Please  refer to After Visit Summary for other counseling recommendations.   Return in about 2 weeks (around 02/22/2023) for [facility].   Brock Bad, MD 02/08/23

## 2023-02-08 NOTE — Progress Notes (Signed)
Pt presents for ROB visit. No concerns.  

## 2023-02-25 ENCOUNTER — Encounter: Payer: Medicaid Other | Admitting: Advanced Practice Midwife

## 2023-02-27 ENCOUNTER — Encounter: Payer: Self-pay | Admitting: Obstetrics and Gynecology

## 2023-02-27 ENCOUNTER — Ambulatory Visit (INDEPENDENT_AMBULATORY_CARE_PROVIDER_SITE_OTHER): Payer: Medicaid Other | Admitting: Obstetrics and Gynecology

## 2023-02-27 VITALS — BP 111/71 | HR 94 | Wt 136.2 lb

## 2023-02-27 DIAGNOSIS — Z3A32 32 weeks gestation of pregnancy: Secondary | ICD-10-CM

## 2023-02-27 DIAGNOSIS — Z3480 Encounter for supervision of other normal pregnancy, unspecified trimester: Secondary | ICD-10-CM

## 2023-02-27 DIAGNOSIS — Z8759 Personal history of other complications of pregnancy, childbirth and the puerperium: Secondary | ICD-10-CM

## 2023-02-27 DIAGNOSIS — Z3483 Encounter for supervision of other normal pregnancy, third trimester: Secondary | ICD-10-CM

## 2023-02-27 NOTE — Progress Notes (Signed)
   PRENATAL VISIT NOTE  Subjective:  Leah Price is a 22 y.o. G3P2002 at [redacted]w[redacted]d being seen today for ongoing prenatal care.  She is currently monitored for the following issues for this low-risk pregnancy and has History of prior pregnancy with IUGR newborn; Supervision of other normal pregnancy, antepartum; and Echogenic intracardiac focus of fetus on prenatal ultrasound on their problem list.  Patient reports reports occasional back pain.  Contractions: Irritability. Vag. Bleeding: None.  Movement: Present. Denies leaking of fluid.   The following portions of the patient's history were reviewed and updated as appropriate: allergies, current medications, past family history, past medical history, past social history, past surgical history and problem list.   Objective:   Vitals:   02/27/23 1352  BP: 111/71  Pulse: 94  Weight: 136 lb 3.2 oz (61.8 kg)    Fetal Status: Fetal Heart Rate (bpm): 129   Movement: Present     General:  Alert, oriented and cooperative. Patient is in no acute distress.  Skin: Skin is warm and dry. No rash noted.   Cardiovascular: Normal heart rate noted  Respiratory: Normal respiratory effort, no problems with respiration noted  Abdomen: Soft, gravid, appropriate for gestational age.  Pain/Pressure: Present     Pelvic: Cervical exam deferred        Extremities: Normal range of motion.  Edema: None  Mental Status: Normal mood and affect. Normal behavior. Normal judgment and thought content.   Assessment and Plan:  Pregnancy: G3P2002 at [redacted]w[redacted]d 1. Supervision of other normal pregnancy, antepartum BP and FHR normal  Feeling regular fetal movement  2. [redacted] weeks gestation of pregnancy Anticipatory guidance regarding future appts Discussed supportive measures for back pain  3. History of prior pregnancy with IUGR newborn 5/21 EFW 48%, AFI nml Serial growths per MFM, next scheduled 7/09   Preterm labor symptoms and general obstetric precautions including  but not limited to vaginal bleeding, contractions, leaking of fluid and fetal movement were reviewed in detail with the patient. Please refer to After Visit Summary for other counseling recommendations.    Future Appointments  Date Time Provider Department Center  03/11/2023  4:10 PM Hurshel Party, CNM [facility] None  03/12/2023 11:15 AM [facility] NURSE [facility]  03/12/2023 11:30 AM [facility] US3 [facility]  03/25/2023  1:30 PM Leftwich-Kirby, Wilmer Floor, CNM [facility] None  04/01/2023  1:30 PM Lennart Pall, MD [facility] None    Albertine Grates, [place]

## 2023-02-27 NOTE — Progress Notes (Signed)
Pt presents for ROB visit. Pt reports back pain and possible shedding of the mucous plug with pink tint.

## 2023-03-11 ENCOUNTER — Ambulatory Visit (INDEPENDENT_AMBULATORY_CARE_PROVIDER_SITE_OTHER): Payer: Medicaid Other | Admitting: Advanced Practice Midwife

## 2023-03-11 ENCOUNTER — Encounter: Payer: Self-pay | Admitting: Advanced Practice Midwife

## 2023-03-11 VITALS — BP 107/75 | HR 85 | Wt 137.0 lb

## 2023-03-11 DIAGNOSIS — Z8759 Personal history of other complications of pregnancy, childbirth and the puerperium: Secondary | ICD-10-CM

## 2023-03-11 DIAGNOSIS — Z3A34 34 weeks gestation of pregnancy: Secondary | ICD-10-CM

## 2023-03-11 DIAGNOSIS — Z3480 Encounter for supervision of other normal pregnancy, unspecified trimester: Secondary | ICD-10-CM

## 2023-03-11 NOTE — Progress Notes (Signed)
   PRENATAL VISIT NOTE  Subjective:  Leah Price is a 22 y.o. G3P2002 at [redacted]w[redacted]d being seen today for ongoing prenatal care.  She is currently monitored for the following issues for this low-risk pregnancy and has History of prior pregnancy with IUGR newborn; Supervision of other normal pregnancy, antepartum; and Echogenic intracardiac focus of fetus on prenatal ultrasound on their problem list.  Patient reports occasional contractions.  Contractions: Irritability. Vag. Bleeding: None.  Movement: Present. Denies leaking of fluid.   The following portions of the patient's history were reviewed and updated as appropriate: allergies, current medications, past family history, past medical history, past social history, past surgical history and problem list.   Objective:   Vitals:   03/11/23 1602  BP: 107/75  Pulse: 85  Weight: 137 lb (62.1 kg)    Fetal Status: Fetal Heart Rate (bpm): 145   Movement: Present     General:  Alert, oriented and cooperative. Patient is in no acute distress.  Skin: Skin is warm and dry. No rash noted.   Cardiovascular: Normal heart rate noted  Respiratory: Normal respiratory effort, no problems with respiration noted  Abdomen: Soft, gravid, appropriate for gestational age.  Pain/Pressure: Present     Pelvic: Cervical exam deferred        Extremities: Normal range of motion.  Edema: None  Mental Status: Normal mood and affect. Normal behavior. Normal judgment and thought content.   Assessment and Plan:  Pregnancy: G3P2002 at [redacted]w[redacted]d 1. Supervision of other normal pregnancy, antepartum --Anticipatory guidance about next visits/weeks of pregnancy given.  --Pt desires elective IOL at 39 weeks.  Discussed elective induction process, pt added to list for 8/10 and medical IOL scheduled for 8/24.  --Orders placed  2. History of prior pregnancy with IUGR newborn --EFW 48% at 27 weeks, Growth [place] tomorrow, 7/9  3. [redacted] weeks gestation of pregnancy   Preterm labor  symptoms and general obstetric precautions including but not limited to vaginal bleeding, contractions, leaking of fluid and fetal movement were reviewed in detail with the patient. Please refer to After Visit Summary for other counseling recommendations.   Return in about 2 weeks (around 03/25/2023) for LOB.  Future Appointments  Date Time Provider Department Center  03/12/2023 11:15 AM [facility] NURSE [facility]  03/12/2023 11:30 AM [facility] US3 [facility]  03/25/2023  1:30 PM Leftwich-Kirby, Wilmer Floor, CNM [facility] None  04/01/2023  1:30 PM Lennart Pall, MD [facility] None  04/27/2023  6:30 AM [facility] SCHED ROOM [facility] None    Sharen Counter, CNM

## 2023-03-12 ENCOUNTER — Other Ambulatory Visit: Payer: Self-pay | Admitting: *Deleted

## 2023-03-12 ENCOUNTER — Ambulatory Visit: Payer: Medicaid Other | Attending: Maternal & Fetal Medicine

## 2023-03-12 ENCOUNTER — Ambulatory Visit: Payer: Medicaid Other | Admitting: *Deleted

## 2023-03-12 VITALS — BP 114/71 | HR 107

## 2023-03-12 DIAGNOSIS — O358XX0 Maternal care for other (suspected) fetal abnormality and damage, not applicable or unspecified: Secondary | ICD-10-CM | POA: Diagnosis not present

## 2023-03-12 DIAGNOSIS — O99013 Anemia complicating pregnancy, third trimester: Secondary | ICD-10-CM

## 2023-03-12 DIAGNOSIS — O365930 Maternal care for other known or suspected poor fetal growth, third trimester, not applicable or unspecified: Secondary | ICD-10-CM | POA: Insufficient documentation

## 2023-03-12 DIAGNOSIS — Z3480 Encounter for supervision of other normal pregnancy, unspecified trimester: Secondary | ICD-10-CM

## 2023-03-12 DIAGNOSIS — O365931 Maternal care for other known or suspected poor fetal growth, third trimester, fetus 1: Secondary | ICD-10-CM

## 2023-03-12 DIAGNOSIS — Z148 Genetic carrier of other disease: Secondary | ICD-10-CM | POA: Diagnosis not present

## 2023-03-12 DIAGNOSIS — Z8759 Personal history of other complications of pregnancy, childbirth and the puerperium: Secondary | ICD-10-CM

## 2023-03-12 DIAGNOSIS — O35BXX0 Maternal care for other (suspected) fetal abnormality and damage, fetal cardiac anomalies, not applicable or unspecified: Secondary | ICD-10-CM | POA: Diagnosis not present

## 2023-03-12 DIAGNOSIS — D563 Thalassemia minor: Secondary | ICD-10-CM | POA: Diagnosis not present

## 2023-03-12 DIAGNOSIS — Z362 Encounter for other antenatal screening follow-up: Secondary | ICD-10-CM | POA: Insufficient documentation

## 2023-03-12 DIAGNOSIS — Z3A34 34 weeks gestation of pregnancy: Secondary | ICD-10-CM | POA: Diagnosis not present

## 2023-03-12 DIAGNOSIS — Z3689 Encounter for other specified antenatal screening: Secondary | ICD-10-CM | POA: Insufficient documentation

## 2023-03-12 DIAGNOSIS — O09293 Supervision of pregnancy with other poor reproductive or obstetric history, third trimester: Secondary | ICD-10-CM | POA: Insufficient documentation

## 2023-03-12 DIAGNOSIS — O09292 Supervision of pregnancy with other poor reproductive or obstetric history, second trimester: Secondary | ICD-10-CM | POA: Insufficient documentation

## 2023-03-24 ENCOUNTER — Other Ambulatory Visit: Payer: Self-pay

## 2023-03-24 ENCOUNTER — Encounter (HOSPITAL_COMMUNITY): Payer: Self-pay | Admitting: Obstetrics and Gynecology

## 2023-03-24 ENCOUNTER — Inpatient Hospital Stay (HOSPITAL_COMMUNITY)
Admission: AD | Admit: 2023-03-24 | Discharge: 2023-03-24 | Disposition: A | Discharge status: Home / Self Care | Payer: Medicaid Other | Source: Home / Self Care | Attending: Obstetrics and Gynecology | Admitting: Obstetrics and Gynecology

## 2023-03-24 DIAGNOSIS — O99891 Other specified diseases and conditions complicating pregnancy: Secondary | ICD-10-CM | POA: Diagnosis not present

## 2023-03-24 DIAGNOSIS — O479 False labor, unspecified: Secondary | ICD-10-CM | POA: Diagnosis not present

## 2023-03-24 DIAGNOSIS — R000 Tachycardia, unspecified: Secondary | ICD-10-CM | POA: Diagnosis not present

## 2023-03-24 DIAGNOSIS — R8279 Other abnormal findings on microbiological examination of urine: Secondary | ICD-10-CM | POA: Insufficient documentation

## 2023-03-24 DIAGNOSIS — O4703 False labor before 37 completed weeks of gestation, third trimester: Secondary | ICD-10-CM | POA: Insufficient documentation

## 2023-03-24 DIAGNOSIS — M549 Dorsalgia, unspecified: Secondary | ICD-10-CM | POA: Diagnosis present

## 2023-03-24 DIAGNOSIS — Z87440 Personal history of urinary (tract) infections: Secondary | ICD-10-CM | POA: Diagnosis not present

## 2023-03-24 DIAGNOSIS — Z3A36 36 weeks gestation of pregnancy: Secondary | ICD-10-CM | POA: Diagnosis not present

## 2023-03-24 DIAGNOSIS — R3589 Other polyuria: Secondary | ICD-10-CM | POA: Insufficient documentation

## 2023-03-24 DIAGNOSIS — R350 Frequency of micturition: Secondary | ICD-10-CM

## 2023-03-24 DIAGNOSIS — O26893 Other specified pregnancy related conditions, third trimester: Secondary | ICD-10-CM | POA: Diagnosis present

## 2023-03-24 LAB — URINALYSIS, ROUTINE W REFLEX MICROSCOPIC
Bilirubin Urine: NEGATIVE
Glucose, UA: NEGATIVE mg/dL
Hgb urine dipstick: NEGATIVE
Ketones, ur: NEGATIVE mg/dL
Nitrite: NEGATIVE
Protein, ur: NEGATIVE mg/dL
Specific Gravity, Urine: 1.011 (ref 1.005–1.030)
pH: 7 (ref 5.0–8.0)

## 2023-03-24 MED ORDER — CEFADROXIL 500 MG PO CAPS
1000.0000 mg | ORAL_CAPSULE | Freq: Two times a day (BID) | ORAL | Status: AC
  Administered 2023-03-24: 1000 mg via ORAL
  Filled 2023-03-24: qty 2

## 2023-03-24 MED ORDER — CEFADROXIL 500 MG PO CAPS: 500.0000 mg | ORAL_CAPSULE | Freq: Two times a day (BID) | ORAL | 0 refills | Status: DC

## 2023-03-24 NOTE — MAU Note (Signed)
.  Leah Price is a 22 y.o. at [redacted]w[redacted]d here in MAU reporting: loss of mucous plug yesterday, tightening of belly today, unsure of ctx, and lower back pain that comes and goes.  Denies LOF +FM   Pain score: 8/10 Vitals:   03/24/23 1125  BP: 114/72  Pulse: (!) 119  Resp: 15  Temp: 98 F (36.7 C)  SpO2: 100%     FHT:145 Lab orders placed from triage:   ua

## 2023-03-24 NOTE — MAU Provider Note (Signed)
History     CSN: [id number]  Arrival date and time: 03/24/23 1114   None     Chief Complaint  Patient presents with   Back Pain   Contractions   Leah Price is a 22 y.o. G3P2002 at [redacted]w[redacted]d who presents for a labor check. She denies LOF. Endorses +FM. Lost her mucus plug yesterday. She is also experiencing polyuria x4 days. She denies fever and dysuria. She endorses uterine tightening after urination. She was treated for a UTI several months ago. 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    Past Surgical History:  Procedure Laterality Date   NO PAST SURGERIES      Family History  Problem Relation Age of Onset   Hypertension Mother    Miscarriages / [place] Mother    Hypertension Maternal Grandmother    Hyperlipidemia Maternal Grandmother    ADD / ADHD Neg Hx    Alcohol abuse Neg Hx    Anxiety disorder Neg Hx    Arthritis Neg Hx    Asthma Neg Hx    Birth defects Neg Hx    Cancer Neg Hx    COPD Neg Hx    Depression Neg Hx    Diabetes Neg Hx    Drug abuse Neg Hx    Hearing loss Neg Hx    Heart disease Neg Hx    Intellectual disability Neg Hx    Kidney disease Neg Hx    Learning disabilities Neg Hx    Obesity Neg Hx    Stroke Neg Hx    Vision loss Neg Hx    Varicose Veins Neg Hx     Social History   Tobacco Use   Smoking status: Former    Passive exposure: Never   Smokeless tobacco: Never  Vaping Use   Vaping status: Former  Substance Use Topics   Alcohol use: Not Currently   Drug use: Never    Allergies:  Allergies  Allergen Reactions   Shrimp Extract      Medications Prior to Admission  Medication Sig Dispense Refill Last Dose   Prenat-Fe Poly-Methfol-FA-DHA (VITAFOL ULTRA) 29-0.6-0.4-200 MG CAPS Take 1 capsule by mouth daily before breakfast. 90 capsule 4 03/24/2023   Blood Pressure Monitoring (BLOOD PRESSURE KIT) DEVI 1 kit by Does not apply route once a week. (Patient not taking: Reported on 03/12/2023) 1 each 0     Review of Systems  Constitutional:  Negative for fever.  Gastrointestinal:  Positive for abdominal pain.  Endocrine: Positive for polyuria.  Genitourinary:  Positive for frequency. Negative for difficulty urinating and dysuria.  Musculoskeletal:  Positive for back pain.   Physical Exam   Blood pressure 106/73, pulse (!) 120, temperature 98 F (36.7 C), temperature source Oral, resp. rate 16, height 5\' 5"  (1.651 m), weight 65 kg, last menstrual period  07/04/2022, SpO2 100%. NST:  Baseline: 130 bpm, Variability: Good {> 6 bpm), Accelerations: Reactive, and Decelerations: Absent  Physical Exam Vitals reviewed.  Constitutional:      General: She is not in acute distress.    Appearance: She is not ill-appearing, toxic-appearing or diaphoretic.  HENT:     Head: Normocephalic.     Right Ear: External ear normal.     Left Ear: External ear normal.  Eyes:     Extraocular Movements: Extraocular movements intact.     Conjunctiva/sclera: Conjunctivae normal.  Cardiovascular:     Rate and Rhythm: Regular rhythm. Tachycardia present.     Heart sounds: Normal heart sounds.  Pulmonary:     Effort: Pulmonary effort is normal.     Breath sounds: Normal breath sounds.  Abdominal:     Tenderness: There is no right CVA tenderness or left CVA tenderness.  Musculoskeletal:     Right lower leg: No edema.     Left lower leg: No edema.  Neurological:     Mental Status: She is alert and oriented to person, place, and time.  Psychiatric:        Mood and Affect: Mood normal.        Behavior: Behavior normal.     MAU Course   Procedures  MDM Results for orders placed or performed during the hospital encounter of 03/24/23 (from the past 24 hour(s))  Urinalysis, Routine w reflex microscopic -Urine, Clean Catch     Status: Abnormal   Collection Time: 03/24/23 11:36 AM  Result Value Ref Range   Color, Urine YELLOW YELLOW   APPearance HAZY (A) CLEAR   Specific Gravity, Urine 1.011 1.005 - 1.030   pH 7.0 5.0 - 8.0   Glucose, UA NEGATIVE NEGATIVE mg/dL   Hgb urine dipstick NEGATIVE NEGATIVE   Bilirubin Urine NEGATIVE NEGATIVE   Ketones, ur NEGATIVE NEGATIVE mg/dL   Protein, ur NEGATIVE NEGATIVE mg/dL   Nitrite NEGATIVE NEGATIVE   Leukocytes,Ua MODERATE (A) NEGATIVE   RBC / HPF 0-5 0 - 5 RBC/hpf   WBC, UA 11-20 0 - 5 WBC/hpf   Bacteria, UA RARE (A) NONE SEEN   Squamous Epithelial / HPF 21-50 0 - 5 /HPF   Mucus PRESENT    Urine culture collected  Assessment and Plan  1. False labor  2. Urinary frequency X4 days and afebrile. Has a history of UTI with back pain, tachycardia, and moderate leukocytes on UA. Duricef x1 given here.  -Continue duricef for a total of 10 days -Culture pending  3. [redacted] weeks gestation of pregnancy -Continue routine OB care  Reyanne Hussar [place] 03/24/2023, 12:37 PM

## 2023-03-25 ENCOUNTER — Other Ambulatory Visit (HOSPITAL_COMMUNITY)
Admission: RE | Admit: 2023-03-25 | Discharge: 2023-03-25 | Disposition: A | Discharge status: Home / Self Care | Payer: Medicaid Other | Source: Ambulatory Visit | Attending: Advanced Practice Midwife | Admitting: Advanced Practice Midwife

## 2023-03-25 ENCOUNTER — Encounter: Payer: Self-pay | Admitting: Advanced Practice Midwife

## 2023-03-25 ENCOUNTER — Ambulatory Visit: Payer: Medicaid Other | Admitting: Advanced Practice Midwife

## 2023-03-25 VITALS — BP 108/70 | HR 82 | Wt 138.6 lb

## 2023-03-25 DIAGNOSIS — Z3689 Encounter for other specified antenatal screening: Secondary | ICD-10-CM | POA: Diagnosis not present

## 2023-03-25 DIAGNOSIS — Z3A36 36 weeks gestation of pregnancy: Secondary | ICD-10-CM | POA: Diagnosis present

## 2023-03-25 DIAGNOSIS — Z3480 Encounter for supervision of other normal pregnancy, unspecified trimester: Secondary | ICD-10-CM

## 2023-03-25 LAB — CULTURE, OB URINE: Special Requests: NORMAL

## 2023-03-25 NOTE — Progress Notes (Signed)
   PRENATAL VISIT NOTE  Subjective:  Leah Price is a 22 y.o. G3P2002 at [redacted]w[redacted]d being seen today for ongoing prenatal care.  She is currently monitored for the following issues for this low-risk pregnancy and has History of prior pregnancy with IUGR newborn; Supervision of other normal pregnancy, antepartum; and Echogenic intracardiac focus of fetus on prenatal ultrasound on their problem list.  Patient reports occasional contractions.  Contractions: Irritability. Vag. Bleeding: None.  Movement: Present. Denies leaking of fluid.   The following portions of the patient's history were reviewed and updated as appropriate: allergies, current medications, past family history, past medical history, past social history, past surgical history and problem list.   Objective:   Vitals:   03/25/23 1348  BP: 108/70  Pulse: 82  Weight: 138 lb 9.6 oz (62.9 kg)    Fetal Status: Fetal Heart Rate (bpm): 138 Fundal Height: 33 cm Movement: Present     General:  Alert, oriented and cooperative. Patient is in no acute distress.  Skin: Skin is warm and dry. No rash noted.   Cardiovascular: Normal heart rate noted  Respiratory: Normal respiratory effort, no problems with respiration noted  Abdomen: Soft, gravid, appropriate for gestational age.  Pain/Pressure: Present     Pelvic: Cervical exam deferred        Extremities: Normal range of motion.  Edema: None  Mental Status: Normal mood and affect. Normal behavior. Normal judgment and thought content.   Assessment and Plan:  Pregnancy: G3P2002 at [redacted]w[redacted]d 1. Supervision of other normal pregnancy, antepartum --Anticipatory guidance about next visits/weeks of pregnancy given.  --Pt concerned about baby' s size, asked about IOL for growth restriction.  EFW on 7/9 was 14%.  F/U US on 8/7.  No recommendations now, unless EFW is below 10%.  --FH 33 today   2. [redacted] weeks gestation of pregnancy   Preterm labor symptoms and general obstetric precautions including  but not limited to vaginal bleeding, contractions, leaking of fluid and fetal movement were reviewed in detail with the patient. Please refer to After Visit Summary for other counseling recommendations.   Return in about 1 week (around 04/01/2023) for LOB.  Future Appointments  Date Time Provider Department Center  04/01/2023  1:30 PM Lennart Pall, MD [facility] None  04/10/2023 12:45 PM [facility] US4 [facility]  04/27/2023  6:30 AM [facility] SCHED ROOM [facility] None    Sharen Counter, CNM

## 2023-03-25 NOTE — Addendum Note (Signed)
Addended by: Jearld Adjutant on: 03/25/2023 02:23 PM   Modules accepted: Orders

## 2023-03-27 LAB — CERVICOVAGINAL ANCILLARY ONLY
Chlamydia: NEGATIVE
Comment: NEGATIVE
Comment: NEGATIVE
Comment: NORMAL
Neisseria Gonorrhea: NEGATIVE
Trichomonas: NEGATIVE

## 2023-04-01 ENCOUNTER — Ambulatory Visit (INDEPENDENT_AMBULATORY_CARE_PROVIDER_SITE_OTHER): Payer: Medicaid Other | Admitting: Obstetrics and Gynecology

## 2023-04-01 VITALS — BP 111/74 | HR 76 | Wt 140.9 lb

## 2023-04-01 DIAGNOSIS — O283 Abnormal ultrasonic finding on antenatal screening of mother: Secondary | ICD-10-CM

## 2023-04-01 DIAGNOSIS — Z8759 Personal history of other complications of pregnancy, childbirth and the puerperium: Secondary | ICD-10-CM

## 2023-04-01 DIAGNOSIS — Z3480 Encounter for supervision of other normal pregnancy, unspecified trimester: Secondary | ICD-10-CM

## 2023-04-01 DIAGNOSIS — Z3A37 37 weeks gestation of pregnancy: Secondary | ICD-10-CM

## 2023-04-01 NOTE — Progress Notes (Signed)
   PRENATAL VISIT NOTE  Subjective:  Leah Price is a 22 y.o. G3P2002 at [redacted]w[redacted]d being seen today for ongoing prenatal care.  She is currently monitored for the following issues for this low-risk pregnancy and has History of prior pregnancy with IUGR newborn; Supervision of other normal pregnancy, antepartum; and Echogenic intracardiac focus of fetus on prenatal ultrasound on their problem list.  Patient reports backache and lower abdominal pressure .  Contractions: Irregular. Vag. Bleeding: None.  Movement: Present. Denies leaking of fluid.   The following portions of the patient's history were reviewed and updated as appropriate: allergies, current medications, past family history, past medical history, past social history, past surgical history and problem list.   Objective:   Vitals:   04/01/23 1343  BP: 111/74  Pulse: 76  Weight: 140 lb 14.4 oz (63.9 kg)   Fetal Status: Fetal Heart Rate (bpm): 130 Fundal Height: 35 cm Movement: Present     General:  Alert, oriented and cooperative. Patient is in no acute distress.  Skin: Skin is warm and dry. No rash noted.   Cardiovascular: Normal heart rate noted  Respiratory: Normal respiratory effort, no problems with respiration noted  Abdomen: Soft, gravid, appropriate for gestational age.  Pain/Pressure: Present      Assessment and Plan:  Pregnancy: G3P2002 at [redacted]w[redacted]d 1. Supervision of other normal pregnancy, antepartum 2. [redacted] weeks gestation of pregnancy Discussed labor symptoms Exam c/w round ligament pain, reviewed warning si/sx when to call for symptoms that are more than round ligament  3. History of prior pregnancy with IUGR newborn Growth [place] next week (8/7), last @34 /3 2126g (14%), AC (29%), ceph, ant, 12.99  4. Echogenic intracardiac focus of fetus on prenatal ultrasound LR NIPS  Term labor symptoms and general obstetric precautions including but not limited to vaginal bleeding, contractions, leaking of fluid and fetal  movement were reviewed in detail with the patient.  Please refer to After Visit Summary for other counseling recommendations.   Return in about 1 week (around 04/08/2023) for return OB at 38 weeks.  Future Appointments  Date Time Provider Department Center  04/08/2023  2:10 PM Lennart Pall, MD [facility] None  04/10/2023 12:45 PM [facility] US[facility]  04/27/2023  6:30 AM [facility] SCHED ROOM [facility] None    Lennart Pall, MD

## 2023-04-03 ENCOUNTER — Other Ambulatory Visit: Payer: Self-pay

## 2023-04-03 ENCOUNTER — Encounter (HOSPITAL_COMMUNITY): Payer: Self-pay | Admitting: Obstetrics and Gynecology

## 2023-04-03 ENCOUNTER — Inpatient Hospital Stay (HOSPITAL_COMMUNITY)
Admission: AD | Admit: 2023-04-03 | Discharge: 2023-04-04 | Disposition: A | Discharge status: Home / Self Care | Payer: Medicaid Other | Attending: Obstetrics and Gynecology | Admitting: Obstetrics and Gynecology

## 2023-04-03 DIAGNOSIS — R0602 Shortness of breath: Secondary | ICD-10-CM | POA: Diagnosis present

## 2023-04-03 DIAGNOSIS — O479 False labor, unspecified: Secondary | ICD-10-CM

## 2023-04-03 DIAGNOSIS — Z3A37 37 weeks gestation of pregnancy: Secondary | ICD-10-CM | POA: Insufficient documentation

## 2023-04-03 DIAGNOSIS — M546 Pain in thoracic spine: Secondary | ICD-10-CM | POA: Insufficient documentation

## 2023-04-03 DIAGNOSIS — M549 Dorsalgia, unspecified: Secondary | ICD-10-CM | POA: Diagnosis not present

## 2023-04-03 DIAGNOSIS — O99891 Other specified diseases and conditions complicating pregnancy: Secondary | ICD-10-CM | POA: Diagnosis not present

## 2023-04-03 MED ORDER — CYCLOBENZAPRINE HCL 5 MG PO TABS
5.0000 mg | ORAL_TABLET | Freq: Once | ORAL | Status: AC
  Administered 2023-04-03: 5 mg via ORAL
  Filled 2023-04-03: qty 1

## 2023-04-03 NOTE — MAU Provider Note (Signed)
Chief Complaint:  Contractions, Back Pain, and Shortness of Breath   Event Date/Time   First Provider Initiated Contact with Patient 04/03/23 2240     HPI: Leah Price is a 22 y.o. G3P2002 at 76w4dwho presents to maternity admissions reporting intermittent contractions but the main reason she came in is the mid back pain.  Spasm-like in nature. Has not tried anything   States is only short of breath when walking around "because my belly is heavy"   Denies dyspnea at rest.. She reports good fetal movement, denies LOF, vaginal bleeding, h/a, n/v, or fever/chills.    Back Pain The problem is unchanged. The pain is present in the lumbar spine and thoracic spine. The quality of the pain is described as aching and cramping. The pain does not radiate. The symptoms are aggravated by bending and sitting. Stiffness is present All day. Pertinent negatives include no chest pain, dysuria, fever, numbness or paresis. She has tried nothing for the symptoms.  Shortness of Breath Pertinent negatives include no chest pain or fever. The symptoms are aggravated by exercise. The patient has no known risk factors for DVT/PE. She has tried nothing for the symptoms. There is no history of asthma.   RN Note Braylon Gille is a 22 y.o. at [redacted]w[redacted]d here in MAU reporting: ctx every 20-25 minutes, along with back pain and SOB. Reports increase in vaginal discharge but denies VB or LOF. 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    Past obstetric history: OB History  Gravida Para Term Preterm AB Living  3 2 2     2   SAB IAB Ectopic Multiple Live Births        0 2    # Outcome Date GA Lbr Len/2nd Weight Sex Type Anes PTL Lv  3 Current           2 Term 07/31/20 [redacted]w[redacted]d 00:10 / 00:05 2271 g F Vag-Spont EPI  LIV  1  Term 08/15/18 [redacted]w[redacted]d  2240 g  Vag-Spont   LIV    Past Surgical History: Past Surgical History:  Procedure Laterality Date   NO PAST SURGERIES      Family History: Family History  Problem Relation Age of Onset   Hypertension Mother    Miscarriages / [place] Mother    Hypertension Maternal Grandmother    Hyperlipidemia Maternal Grandmother    ADD / ADHD Neg Hx    Alcohol abuse Neg Hx    Anxiety disorder Neg Hx    Arthritis Neg Hx    Asthma Neg Hx    Birth defects Neg Hx    Cancer Neg Hx    COPD Neg Hx    Depression Neg Hx    Diabetes Neg Hx    Drug abuse Neg Hx    Hearing loss Neg Hx    Heart disease Neg Hx    Intellectual disability Neg Hx    Kidney disease Neg Hx    Learning disabilities Neg Hx    Obesity Neg Hx    Stroke Neg Hx    Vision loss Neg Hx    Varicose Veins Neg Hx     Social History: Social History   Tobacco Use   Smoking status: Former    Passive exposure: Never   Smokeless  tobacco: Never  Vaping Use   Vaping status: Former  Substance Use Topics   Alcohol use: Not Currently   Drug use: Never    Allergies:  Allergies  Allergen Reactions   Shellfish Allergy    Shrimp Extract     Meds:  Medications Prior to Admission  Medication Sig Dispense Refill Last Dose   Prenat-Fe Poly-Methfol-FA-DHA (VITAFOL ULTRA) 29-0.6-0.4-200 MG CAPS Take 1 capsule by mouth daily before breakfast. 90 capsule 4 04/03/2023   Blood Pressure Monitoring (BLOOD PRESSURE KIT) DEVI 1 kit by Does not apply route once a week. (Patient not taking: Reported on 03/12/2023) 1 each 0    cefadroxil (DURICEF) 500 MG capsule Take 1 capsule (500 mg total) by mouth 2 (two) times daily for 9 days. 18 capsule 0     I have reviewed patient's Past Medical Hx, Surgical Hx, Family Hx, Social Hx, medications and allergies.   ROS:  Review of Systems  Constitutional:  Negative for fever.  Respiratory:  Positive for shortness of breath.   Cardiovascular:  Negative for chest pain.   Genitourinary:  Negative for dysuria.  Musculoskeletal:  Positive for back pain.  Neurological:  Negative for numbness.   Other systems negative  Physical Exam  Patient Vitals for the past 24 hrs:  BP Temp Temp src Pulse Resp SpO2 Height Weight  04/03/23 2153 116/73 98 F (36.7 C) Oral 100 20 99 % 5\' 5"  (1.651 m) 65.5 kg   Constitutional: Well-developed, well-nourished female in no acute distress.  Cardiovascular: normal rate and rhythm Respiratory: normal effort, clear to auscultation bilaterally GI: Abd soft, non-tender, gravid appropriate for gestational age.   No rebound or guarding. MS: Extremities nontender, no edema, normal ROM  Mildly tender over midback paraspinal muscles Neurologic: Alert and oriented x 4.  GU: Neg CVAT.  PELVIC EXAM:   Dilation: 1.5 Effacement (%): 50 Cervical Position: Posterior Station: -3 Presentation: Vertex Exam by:: Tashiana Lamarca CNM  FHT:  Baseline 140 , moderate variability, accelerations present, no decelerations Contractions: Irregular     Labs: No results found for this or any previous visit (from the past 24 hour(s)).  B/Positive/-- (02/08 8119)  Imaging:    MAU Course/MDM: I have reviewed the triage vital signs and the nursing notes.   Pertinent labs & imaging results that were available during my care of the patient were reviewed by me and considered in my medical decision making (see chart for details).      I have reviewed her medical records including past results, notes and treatments.   NST reviewed  Treatments in MAU included Flexeril 5mg  which gave some relief.  Did not want anything too strong stating she breastfeeds every hour. .    Assessment: Single IUP at [redacted]w[redacted]d Mid back Pain Irregular contractions, prodromal  Plan: Discharge home Rx Flexeril prn back pain Labor precautions and fetal kick counts Follow up in Office for prenatal visits and recheck Encouraged to return if she develops worsening of symptoms,  increase in pain, fever, or other concerning symptoms.   Pt stable at time of discharge.  Wynelle Bourgeois CNM, MSN Certified Nurse-Midwife 04/03/2023 10:40 PM

## 2023-04-03 NOTE — MAU Note (Signed)
.  Leah Price is a 22 y.o. at [redacted]w[redacted]d here in MAU reporting: ctx every 20-25 minutes, along with back pain and SOB. Reports increase in vaginal discharge but denies VB or LOF. +FM   Onset of complaint: 1500 Pain score: 6 - ctx; 8 - back  Vitals:   04/03/23 2153  BP: 116/73  Pulse: 100  Resp: 20  Temp: 98 F (36.7 C)  SpO2: 99%     FHT:145 Lab orders placed from triage:  UA

## 2023-04-04 DIAGNOSIS — M549 Dorsalgia, unspecified: Secondary | ICD-10-CM

## 2023-04-04 DIAGNOSIS — O479 False labor, unspecified: Secondary | ICD-10-CM

## 2023-04-04 DIAGNOSIS — Z3A37 37 weeks gestation of pregnancy: Secondary | ICD-10-CM

## 2023-04-04 DIAGNOSIS — O99891 Other specified diseases and conditions complicating pregnancy: Secondary | ICD-10-CM

## 2023-04-04 MED ORDER — CYCLOBENZAPRINE HCL 5 MG PO TABS: 5.0000 mg | ORAL_TABLET | Freq: Three times a day (TID) | ORAL | 0 refills | Status: DC | PRN

## 2023-04-04 MED ORDER — CEFADROXIL 500 MG PO CAPS: 500.0000 mg | ORAL_CAPSULE | Freq: Two times a day (BID) | ORAL | 0 refills | Status: DC

## 2023-04-06 ENCOUNTER — Inpatient Hospital Stay (HOSPITAL_COMMUNITY)
Admission: AD | Admit: 2023-04-06 | Discharge: 2023-04-06 | Disposition: A | Discharge status: Home / Self Care | Payer: Medicaid Other | Attending: Obstetrics and Gynecology | Admitting: Obstetrics and Gynecology

## 2023-04-06 ENCOUNTER — Encounter (HOSPITAL_COMMUNITY): Payer: Self-pay | Admitting: Obstetrics and Gynecology

## 2023-04-06 ENCOUNTER — Inpatient Hospital Stay (EMERGENCY_DEPARTMENT_HOSPITAL)
Admission: AD | Admit: 2023-04-06 | Discharge: 2023-04-06 | Disposition: A | Discharge status: Home / Self Care | Payer: Medicaid Other | Source: Home / Self Care | Attending: Obstetrics & Gynecology | Admitting: Obstetrics & Gynecology

## 2023-04-06 DIAGNOSIS — O471 False labor at or after 37 completed weeks of gestation: Secondary | ICD-10-CM

## 2023-04-06 DIAGNOSIS — Z3A38 38 weeks gestation of pregnancy: Secondary | ICD-10-CM

## 2023-04-06 NOTE — MAU Provider Note (Signed)
  S: Ms. Leah Price is a 22 y.o. G3P2002 at [redacted]w[redacted]d  who presents to MAU today complaining contractions q 5-7 minutes since this morning. Was seen earlier today for a labor check and returned because she thought she had been instructed to return this afternoon. No increased frequency or intensity of contractions. She feels more pelvic pressure. She denies vaginal bleeding. She denies LOF. She reports normal fetal movement.    O: BP 124/82   Pulse 92   Temp 98.2 F (36.8 C) (Oral)   Resp 20   LMP 07/04/2022   Cervical exam:  Dilation: 4 Effacement (%): 50 Station: Ballotable Presentation: Vertex Exam by:: Polos RN   Fetal Monitoring: Baseline: 125bpm Variability: moderate Accelerations: >2 15 X15 Decelerations: no  Contractions: none   A: SIUP at [redacted]w[redacted]d  False labor  P: Discharge home with labor precautions   [name] Shela Commons, MD 04/06/2023 4:06 PM

## 2023-04-06 NOTE — MAU Note (Signed)
Leah Price is a 22 y.o. at [redacted]w[redacted]d here in MAU reporting: ctx every 2 minutes since 0300. Pt states she has lower abdominal pain and back pain. Pt denies LOF or VB. +FM  Onset of complaint: 0300 Pain score: 5/10 lower abdomen and back  Vitals:   04/06/23 0648  BP: 109/75  Pulse: 91  Resp: 18  Temp: 97.9 F (36.6 C)  SpO2: 100%     FHT:124 Lab orders placed from triage:  mau labor

## 2023-04-06 NOTE — MAU Provider Note (Signed)
S: Ms. Leah Price is a 22 y.o. G3P2002 at [redacted]w[redacted]d  who presents to MAU today complaining contractions q 2 minutes since 3am this morning. She denies vaginal bleeding. She denies LOF. She reports normal fetal movement.    O: BP 109/75 (BP Location: Left Arm)   Pulse 91   Temp 97.9 F (36.6 C) (Oral)   Resp 18   Ht 5\' 5"  (1.651 m)   Wt 64.4 kg   LMP 07/04/2022   SpO2 99%   BMI 23.63 kg/m   Cervical exam:  Dilation: 4 Effacement (%): 80 Station: -3 Presentation: Vertex Exam by:: K. Cowher RN   Fetal Monitoring: Baseline: 125 Variability: moderate Accelerations: >2 15 X 15 accels Decelerations: no Contractions: every 3-4 mins   A: SIUP at [redacted]w[redacted]d  False labor Reactive NST  P: Discharge home with labor precautions  Sheppard Evens MD MPH OB Fellow, Faculty Practice [facility], [facility] 04/06/2023

## 2023-04-06 NOTE — MAU Note (Signed)
Pt is a G3P2 at 38.0 week reporting contractions that she was seen in MAU for this am.  She was D/C home with no cervical change.  She reports that she was told to come back this afternoon.  CTX pattern is the same as this am but pt reports increased pelvic pressure.    NO OB or Medical concerns reported.

## 2023-04-08 ENCOUNTER — Ambulatory Visit (INDEPENDENT_AMBULATORY_CARE_PROVIDER_SITE_OTHER): Payer: Medicaid Other | Admitting: Obstetrics and Gynecology

## 2023-04-08 VITALS — BP 101/71 | HR 87 | Wt 141.5 lb

## 2023-04-08 DIAGNOSIS — Z8759 Personal history of other complications of pregnancy, childbirth and the puerperium: Secondary | ICD-10-CM

## 2023-04-08 DIAGNOSIS — Z3480 Encounter for supervision of other normal pregnancy, unspecified trimester: Secondary | ICD-10-CM

## 2023-04-08 DIAGNOSIS — Z23 Encounter for immunization: Secondary | ICD-10-CM

## 2023-04-08 DIAGNOSIS — Z3A38 38 weeks gestation of pregnancy: Secondary | ICD-10-CM

## 2023-04-08 DIAGNOSIS — O283 Abnormal ultrasonic finding on antenatal screening of mother: Secondary | ICD-10-CM

## 2023-04-08 NOTE — Progress Notes (Unsigned)
   PRENATAL VISIT NOTE  Subjective:  Leah Price is a 22 y.o. G3P2002 at [redacted]w[redacted]d being seen today for ongoing prenatal care.  She is currently monitored for the following issues for this {Blank single:19197::"high-risk","low-risk"} pregnancy and has History of prior pregnancy with IUGR newborn; Supervision of other normal pregnancy, antepartum; and Echogenic intracardiac focus of fetus on prenatal ultrasound on their problem list.  Patient reports {sx:14538}.  Contractions: Irregular. Vag. Bleeding: Other (spotting).  Movement: (!) Decreased. Denies leaking of fluid.   The following portions of the patient's history were reviewed and updated as appropriate: allergies, current medications, past family history, past medical history, past social history, past surgical history and problem list.   Objective:   Vitals:   04/08/23 1449  BP: 101/71  Pulse: 87  Weight: 141 lb 8 oz (64.2 kg)    Fetal Status:     Movement: (!) Decreased     General:  Alert, oriented and cooperative. Patient is in no acute distress.  Skin: Skin is warm and dry. No rash noted.   Cardiovascular: Normal heart rate noted  Respiratory: Normal respiratory effort, no problems with respiration noted  Abdomen: Soft, gravid, appropriate for gestational age.  Pain/Pressure: Absent      Assessment and Plan:  Pregnancy: G3P2002 at [redacted]w[redacted]d 1. Supervision of other normal pregnancy, antepartum 2. [redacted] weeks gestation of pregnancy ***  3. History of prior pregnancy with IUGR newborn ***  4. Echogenic intracardiac focus of fetus on prenatal ultrasound ***   {Routine Plan:28585}  {Blank single:19197::"Term","Preterm"} labor symptoms and general obstetric precautions including but not limited to vaginal bleeding, contractions, leaking of fluid and fetal movement were reviewed in detail with the patient. Please refer to After Visit Summary for other counseling recommendations.   No follow-ups on file.  Future Appointments   Date Time Provider Department Center  04/10/2023 12:45 PM [facility] US4 [facility]  04/27/2023  6:30 AM [facility] SCHED ROOM [facility] None    Lennart Pall, MD

## 2023-04-08 NOTE — Progress Notes (Addendum)
Pt presents for ROB.  Reports decreased movement, irregular contractions. Pt placed on NST in office. Tdap given today.

## 2023-04-10 ENCOUNTER — Ambulatory Visit: Payer: Medicaid Other | Attending: Maternal & Fetal Medicine

## 2023-04-10 DIAGNOSIS — O35BXX0 Maternal care for other (suspected) fetal abnormality and damage, fetal cardiac anomalies, not applicable or unspecified: Secondary | ICD-10-CM | POA: Diagnosis not present

## 2023-04-10 DIAGNOSIS — Z3A38 38 weeks gestation of pregnancy: Secondary | ICD-10-CM

## 2023-04-10 DIAGNOSIS — O09293 Supervision of pregnancy with other poor reproductive or obstetric history, third trimester: Secondary | ICD-10-CM | POA: Diagnosis not present

## 2023-04-10 DIAGNOSIS — Z8759 Personal history of other complications of pregnancy, childbirth and the puerperium: Secondary | ICD-10-CM

## 2023-04-10 DIAGNOSIS — D582 Other hemoglobinopathies: Secondary | ICD-10-CM

## 2023-04-10 DIAGNOSIS — O285 Abnormal chromosomal and genetic finding on antenatal screening of mother: Secondary | ICD-10-CM

## 2023-04-10 DIAGNOSIS — O365930 Maternal care for other known or suspected poor fetal growth, third trimester, not applicable or unspecified: Secondary | ICD-10-CM

## 2023-04-10 DIAGNOSIS — O365931 Maternal care for other known or suspected poor fetal growth, third trimester, fetus 1: Secondary | ICD-10-CM | POA: Diagnosis not present

## 2023-04-10 DIAGNOSIS — D563 Thalassemia minor: Secondary | ICD-10-CM

## 2023-04-11 ENCOUNTER — Inpatient Hospital Stay (HOSPITAL_COMMUNITY)
Admission: AD | Admit: 2023-04-11 | Discharge: 2023-04-13 | DRG: 807 | Disposition: A | Discharge status: Home / Self Care | Payer: Medicaid Other | Attending: Family Medicine | Admitting: Family Medicine

## 2023-04-11 DIAGNOSIS — Z3480 Encounter for supervision of other normal pregnancy, unspecified trimester: Secondary | ICD-10-CM

## 2023-04-11 DIAGNOSIS — Z87891 Personal history of nicotine dependence: Secondary | ICD-10-CM

## 2023-04-11 DIAGNOSIS — Z148 Genetic carrier of other disease: Secondary | ICD-10-CM

## 2023-04-11 DIAGNOSIS — Z3A38 38 weeks gestation of pregnancy: Secondary | ICD-10-CM

## 2023-04-11 DIAGNOSIS — O283 Abnormal ultrasonic finding on antenatal screening of mother: Secondary | ICD-10-CM | POA: Diagnosis present

## 2023-04-11 DIAGNOSIS — O9081 Anemia of the puerperium: Principal | ICD-10-CM | POA: Diagnosis not present

## 2023-04-12 ENCOUNTER — Encounter (HOSPITAL_COMMUNITY): Payer: Self-pay | Admitting: Obstetrics & Gynecology

## 2023-04-12 DIAGNOSIS — O4202 Full-term premature rupture of membranes, onset of labor within 24 hours of rupture: Secondary | ICD-10-CM

## 2023-04-12 DIAGNOSIS — Z3A38 38 weeks gestation of pregnancy: Secondary | ICD-10-CM

## 2023-04-12 DIAGNOSIS — O9081 Anemia of the puerperium: Secondary | ICD-10-CM | POA: Diagnosis not present

## 2023-04-12 DIAGNOSIS — Z87891 Personal history of nicotine dependence: Secondary | ICD-10-CM | POA: Diagnosis not present

## 2023-04-12 DIAGNOSIS — Z148 Genetic carrier of other disease: Secondary | ICD-10-CM | POA: Diagnosis not present

## 2023-04-12 DIAGNOSIS — O26893 Other specified pregnancy related conditions, third trimester: Secondary | ICD-10-CM | POA: Diagnosis present

## 2023-04-12 LAB — TYPE AND SCREEN
ABO/RH(D): B POS
Antibody Screen: NEGATIVE

## 2023-04-12 LAB — CBC
HCT: 33.7 % — ABNORMAL LOW (ref 36.0–46.0)
HCT: 30.8 % — ABNORMAL LOW (ref 36.0–46.0)
Hemoglobin: 10.5 g/dL — ABNORMAL LOW (ref 12.0–15.0)
Hemoglobin: 9.6 g/dL — ABNORMAL LOW (ref 12.0–15.0)
MCH: 21.9 pg — ABNORMAL LOW (ref 26.0–34.0)
MCH: 21.3 pg — ABNORMAL LOW (ref 26.0–34.0)
MCHC: 31.2 g/dL (ref 30.0–36.0)
MCHC: 31.2 g/dL (ref 30.0–36.0)
MCV: 70.2 fL — ABNORMAL LOW (ref 80.0–100.0)
MCV: 68.4 fL — ABNORMAL LOW (ref 80.0–100.0)
Platelets: 409 10*3/uL — ABNORMAL HIGH (ref 150–400)
Platelets: 377 10*3/uL (ref 150–400)
RBC: 4.8 MIL/uL (ref 3.87–5.11)
RBC: 4.5 MIL/uL (ref 3.87–5.11)
RDW: 18.6 % — ABNORMAL HIGH (ref 11.5–15.5)
RDW: 18.4 % — ABNORMAL HIGH (ref 11.5–15.5)
WBC: 10.9 10*3/uL — ABNORMAL HIGH (ref 4.0–10.5)
WBC: 26.3 10*3/uL — ABNORMAL HIGH (ref 4.0–10.5)
nRBC: 0 % (ref 0.0–0.2)
nRBC: 0 % (ref 0.0–0.2)

## 2023-04-12 LAB — RPR: RPR Ser Ql: NONREACTIVE

## 2023-04-12 LAB — POCT FERN TEST: POCT Fern Test: POSITIVE

## 2023-04-12 MED ORDER — TERBUTALINE SULFATE 1 MG/ML IJ SOLN: 0.2500 mg | Freq: Once | INTRAMUSCULAR | Status: DC | PRN

## 2023-04-12 MED ORDER — LACTATED RINGERS IV SOLN: 500.0000 mL | INTRAVENOUS | Status: DC | PRN

## 2023-04-12 MED ORDER — SOD CITRATE-CITRIC ACID 500-334 MG/5ML PO SOLN: 30.0000 mL | ORAL | Status: DC | PRN

## 2023-04-12 MED ORDER — COCONUT OIL OIL: 1.0000 | TOPICAL_OIL | Status: DC | PRN

## 2023-04-12 MED ORDER — OXYTOCIN BOLUS FROM INFUSION
333.0000 mL | Freq: Once | INTRAVENOUS | Status: AC
  Administered 2023-04-12: 333 mL via INTRAVENOUS

## 2023-04-12 MED ORDER — ACETAMINOPHEN 325 MG PO TABS: 650.0000 mg | ORAL_TABLET | ORAL | Status: DC | PRN

## 2023-04-12 MED ORDER — LACTATED RINGERS IV SOLN: INTRAVENOUS | Status: DC

## 2023-04-12 MED ORDER — ONDANSETRON HCL 4 MG PO TABS: 4.0000 mg | ORAL_TABLET | ORAL | Status: DC | PRN

## 2023-04-12 MED ORDER — BENZOCAINE-MENTHOL 20-0.5 % EX AERO
1.0000 | INHALATION_SPRAY | CUTANEOUS | Status: DC | PRN
  Administered 2023-04-12 – 2023-04-13 (×2): 1 via TOPICAL
  Filled 2023-04-12: qty 56

## 2023-04-12 MED ORDER — DIPHENHYDRAMINE HCL 25 MG PO CAPS: 25.0000 mg | ORAL_CAPSULE | Freq: Four times a day (QID) | ORAL | Status: DC | PRN

## 2023-04-12 MED ORDER — SODIUM CHLORIDE 0.9% FLUSH: 3.0000 mL | INTRAVENOUS | Status: DC | PRN

## 2023-04-12 MED ORDER — OXYCODONE-ACETAMINOPHEN 5-325 MG PO TABS
1.0000 | ORAL_TABLET | ORAL | Status: DC | PRN
  Administered 2023-04-12: 1 via ORAL
  Filled 2023-04-12: qty 1

## 2023-04-12 MED ORDER — OXYCODONE-ACETAMINOPHEN 5-325 MG PO TABS: 2.0000 | ORAL_TABLET | ORAL | Status: DC | PRN

## 2023-04-12 MED ORDER — SIMETHICONE 80 MG PO CHEW: 80.0000 mg | CHEWABLE_TABLET | ORAL | Status: DC | PRN

## 2023-04-12 MED ORDER — SODIUM CHLORIDE 0.9 % IV SOLN: 250.0000 mL | INTRAVENOUS | Status: DC | PRN

## 2023-04-12 MED ORDER — SODIUM CHLORIDE 0.9% FLUSH
3.0000 mL | Freq: Two times a day (BID) | INTRAVENOUS | Status: DC
  Administered 2023-04-12 (×2): 3 mL via INTRAVENOUS

## 2023-04-12 MED ORDER — MISOPROSTOL 50MCG HALF TABLET: 50.0000 ug | ORAL_TABLET | Freq: Once | ORAL | Status: DC

## 2023-04-12 MED ORDER — MISOPROSTOL 25 MCG QUARTER TABLET: 25.0000 ug | ORAL_TABLET | Freq: Once | ORAL | Status: DC

## 2023-04-12 MED ORDER — WITCH HAZEL-GLYCERIN EX PADS: 1.0000 | MEDICATED_PAD | CUTANEOUS | Status: DC | PRN

## 2023-04-12 MED ORDER — ZOLPIDEM TARTRATE 5 MG PO TABS: 5.0000 mg | ORAL_TABLET | Freq: Every evening | ORAL | Status: DC | PRN

## 2023-04-12 MED ORDER — DIBUCAINE (PERIANAL) 1 % EX OINT: 1.0000 | TOPICAL_OINTMENT | CUTANEOUS | Status: DC | PRN

## 2023-04-12 MED ORDER — IBUPROFEN 600 MG PO TABS
600.0000 mg | ORAL_TABLET | Freq: Four times a day (QID) | ORAL | Status: DC
  Administered 2023-04-12 – 2023-04-13 (×5): 600 mg via ORAL
  Filled 2023-04-12 (×5): qty 1

## 2023-04-12 MED ORDER — ONDANSETRON HCL 4 MG/2ML IJ SOLN: 4.0000 mg | Freq: Four times a day (QID) | INTRAMUSCULAR | Status: DC | PRN

## 2023-04-12 MED ORDER — OXYTOCIN-SODIUM CHLORIDE 30-0.9 UT/500ML-% IV SOLN
2.5000 [IU]/h | INTRAVENOUS | Status: DC
  Filled 2023-04-12: qty 500

## 2023-04-12 MED ORDER — SENNOSIDES-DOCUSATE SODIUM 8.6-50 MG PO TABS
2.0000 | ORAL_TABLET | ORAL | Status: DC
  Administered 2023-04-12 – 2023-04-13 (×2): 2 via ORAL
  Filled 2023-04-12 (×2): qty 2

## 2023-04-12 MED ORDER — LIDOCAINE HCL (PF) 1 % IJ SOLN: 30.0000 mL | INTRAMUSCULAR | Status: DC | PRN

## 2023-04-12 MED ORDER — PRENATAL MULTIVITAMIN CH
1.0000 | ORAL_TABLET | Freq: Every day | ORAL | Status: DC
  Administered 2023-04-12 – 2023-04-13 (×2): 1 via ORAL
  Filled 2023-04-12 (×2): qty 1

## 2023-04-12 MED ORDER — ONDANSETRON HCL 4 MG/2ML IJ SOLN: 4.0000 mg | INTRAMUSCULAR | Status: DC | PRN

## 2023-04-12 NOTE — MAU Note (Signed)
Pt informed that the ultrasound is considered a limited OB ultrasound and is not intended to be a complete ultrasound exam.  Patient also informed that the ultrasound is not being completed with the intent of assessing for fetal or placental anomalies or any pelvic abnormalities.  Explained that the purpose of today's ultrasound is to assess for presentation.  Patient acknowledges the purpose of the exam and the limitations of the study.  Baby is vertex at time of this scan

## 2023-04-12 NOTE — Discharge Summary (Signed)
Postpartum Discharge Summary  Date of Service updated***     Patient Name: Leah Price DOB: [birth year] MRN: [id number]  Date of admission: 04/11/2023 Delivery date:04/12/2023 Delivering provider: Myrtie Hawk Date of discharge: 04/12/2023  Admitting diagnosis: Normal labor [O80, Z37.9] Intrauterine pregnancy: [redacted]w[redacted]d     Secondary diagnosis:  Principal Problem:   Vaginal delivery Active Problems:   Supervision of other normal pregnancy, antepartum   Echogenic intracardiac focus of fetus on prenatal ultrasound  Additional problems: ***    Discharge diagnosis: Term Pregnancy Delivered                                              Post partum procedures:{Postpartum procedures:23558} Augmentation: N/A Complications: None  Hospital course: Onset of Labor With Vaginal Delivery      22 y.o. yo G3P2002 at [redacted]w[redacted]d was admitted in Active Labor on 04/11/2023. Labor course was complicated bynone  Membrane Rupture Time/Date: 11:00 PM,04/11/2023  Delivery Method:Vaginal, Spontaneous Operative Delivery:N/A Episiotomy: None Lacerations:  Periurethral Patient had a postpartum course complicated by ***.  She is ambulating, tolerating a regular diet, passing flatus, and urinating well. Patient is discharged home in stable condition on 04/12/23.  Newborn Data: Birth date:04/12/2023 Birth time:1:42 AM Gender:Female Living status:Living Apgars:9 ,9  Weight:   Magnesium Sulfate received: {Mag received:30440022} BMZ received: No Rhophylac:N/A MMR:Yes *** T-DaP:Given prenatally Flu: No Transfusion:{Transfusion received:30440034}  Physical exam  Vitals:   04/12/23 0013 04/12/23 0020 04/12/23 0052 04/12/23 0110  BP: 113/78  115/77   Pulse: 88  91   Resp: 18     Temp: 98.1 F (36.7 C)   98.2 F (36.8 C)  TempSrc: Oral   Oral  SpO2: 100%     Weight:  65.3 kg    Height:  5\' 5"  (1.651 m)     General: {Exam; general:21111117} Lochia: {Desc;  appropriate/inappropriate:30686::"appropriate"} Uterine Fundus: {Desc; firm/soft:30687} Incision: {Exam; incision:21111123} DVT Evaluation: {Exam; dvt:2111122} Labs: Lab Results  Component Value Date   WBC 10.9 (H) 04/12/2023   HGB 10.5 (L) 04/12/2023   HCT 33.7 (L) 04/12/2023   MCV 70.2 (L) 04/12/2023   PLT 409 (H) 04/12/2023      Latest Ref Rng & Units 06/26/2021    7:01 PM  CMP  Total Protein 6.5 - 8.1 g/dL 7.1   Total Bilirubin 0.3 - 1.2 mg/dL 0.6   Alkaline Phos 38 - 126 U/L 59   AST 15 - 41 U/L 22   ALT 0 - 44 U/L 9    Edinburgh Score:    08/30/2020    2:16 PM  Edinburgh Postnatal Depression Scale Screening Tool  I have been able to laugh and see the funny side of things. 0  I have looked forward with enjoyment to things. 0  I have blamed myself unnecessarily when things went wrong. 0  I have been anxious or worried for no good reason. 0  I have felt scared or panicky for no good reason. 0  Things have been getting on top of me. 0  I have been so unhappy that I have had difficulty sleeping. 0  I have felt sad or miserable. 0  I have been so unhappy that I have been crying. 0  The thought of harming myself has occurred to me. 0  Edinburgh Postnatal Depression Scale Total 0     After visit meds:  Allergies as of  04/12/2023       Reactions   Shellfish Allergy    Shrimp Extract      Med Rec must be completed prior to using this [facility]***        Discharge home in stable condition Infant Feeding: {Baby feeding:23562} Infant Disposition:{CHL IP OB HOME WITH ZOXWRU:04540} Discharge instruction: per After Visit Summary and Postpartum booklet. Activity: Advance as tolerated. Pelvic rest for 6 weeks.  Diet: {OB JWJX:91478295} Future Appointments: Future Appointments  Date Time Provider Department Center  04/15/2023  1:10 PM Adam Phenix, MD [facility] None  04/23/2023  3:10 PM Hermina Staggers, MD [facility] None  04/27/2023  6:30 AM [facility] SCHED ROOM [facility]  None   Follow up Visit: Message sent to [facility] 8/9  Please schedule this patient for a In person postpartum visit in 6 weeks with the following provider: Any provider. Additional Postpartum F/U: n/a   Low risk pregnancy complicated by:  n/a Delivery mode:  Vaginal, Spontaneous Anticipated Birth Control:  OCPs   04/12/2023 Myrtie Hawk, DO

## 2023-04-12 NOTE — Lactation Note (Signed)
This note was copied from a baby's chart. Lactation Consultation Note Experienced BF mom states BF going well having no trouble. Denies painful latch. Has no questions or concerns. Reviewed newborn feeding habits, STS, I&O, positioning, support. Encouraged to call if needs assistance or questions other wise LC has completed mom.  Patient Name: Leah Price Today's Date: 04/12/2023 Age:24 hours Reason for consult: Initial assessment;Early term 37-38.6wks   Maternal Data Does the patient have breastfeeding experience prior to this delivery?: Yes How long did the patient breastfeed?: 1st BF 11yrs now 35 yrs old, 2nd child 7 months now 1 yrs old  Feeding    LATCH Score Latch: Grasps breast easily, tongue down, lips flanged, rhythmical sucking.  Audible Swallowing: A few with stimulation  Type of Nipple: Everted at rest and after stimulation  Comfort (Breast/Nipple): Soft / non-tender  Hold (Positioning): No assistance needed to correctly position infant at breast.  LATCH Score: 9   Lactation Tools Discussed/Used    Interventions Interventions: Breast feeding basics reviewed;LC Services brochure  Discharge    Consult Status Consult Status: Complete Date: 04/12/23    Charyl Dancer 04/12/2023, 11:12 PM

## 2023-04-12 NOTE — MAU Note (Signed)
.  Leah Price is a 22 y.o. at [redacted]w[redacted]d here in MAU reporting: SROm approx 1 hr ago, reports clear fluids.   Denies VB and reports +FM.   Reports SVE 4.5 this past Monday.   Onset of complaint: 1 hr ago  Pain score: 8/10  Vitals:   04/12/23 0013  BP: 113/78  Pulse: 88  Resp: 18  Temp: 98.1 F (36.7 C)  SpO2: 100%     FHT:143 Lab orders placed from triage:

## 2023-04-12 NOTE — H&P (Signed)
OBSTETRIC ADMISSION HISTORY AND PHYSICAL  Leah Price is a 22 y.o. female G3P2002 with IUP at [redacted]w[redacted]d by [place] presenting for SROM around 2300 on 8/8. She reports +FMs, No LOF, no VB, no blurry vision, headaches or peripheral edema, and RUQ pain.  She plans on breast feeding. She request OCP for birth control. She received her prenatal care at [facility]   Dating: By ultrasound --->  Estimated Date of Delivery: 04/20/23  Sono:    @38  w 4 d, CWD, normal anatomy, cephalic presentation, 2995 g, 20% EFW   Prenatal History/Complications:  Patient Active Problem List   Diagnosis Date Noted   Normal labor 04/12/2023   Echogenic intracardiac focus of fetus on prenatal ultrasound 11/29/2022   Supervision of other normal pregnancy, antepartum 10/03/2022   History of prior pregnancy with IUGR newborn 08/15/2018    Nursing Staff Provider  Office Location [facility] Dating  04/20/2023, by Ultrasound  [facility] Model Arly.Keller ] Traditional [ ]  Centering [ ]  Mom-Baby Dyad    Language  English Anatomy US   3/24 normal except ECIF, declined amnio, normal genetic  Flu Vaccine   declined Genetic/Carrier Screen  NIPS: LR AFP:   Negative Horizon:  TDaP Vaccine   04/09/23 Hgb A1C or  GTT Early  Third trimester Normal  COVID Vaccine Not vaccinated   LAB RESULTS   Rhogam  --/--/PENDING (08/09 0035)  Blood Type --/--/PENDING (08/09 0035)   Baby Feeding Plan Breast Antibody PENDING (08/09 0035)  Contraception OCP Rubella <0.90 (02/08 4270)  Circumcision Yes if a boy RPR Non Reactive (05/24 0917)   Pediatrician  [facility] HBsAg Negative (02/08 0937)   Support Person FOB- Edward HCVAb Non Reactive (02/08 0937)   Prenatal Classes  HIV Non Reactive (05/24 0917)     BTL Consent  GBS Negative/-- (07/22 1433)  VBAC Consent  Pap Diagnosis  Date Value Ref Range Status  10/11/2022 (A)  Final   - Atypical squamous cells of undetermined significance (ASC-US)         DME Rx Arly.Keller ] BP cuff Arly.Keller ] Weight Scale Waterbirth  [ ]   Class [ ]  Consent [ ]  CNM visit  PHQ9 & GAD7 Arly.Keller  ] new OB [  ] 28 weeks  [  ] 36 weeks Induction  [ ]  Orders Entered [ ] Foley Y/N     Past Medical History: Past Medical History:  Diagnosis Date   Alpha thalassemia silent carrier 02/19/2020   Chlamydia infection 06/14/2022   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    Past Surgical History: Past Surgical History:  Procedure Laterality Date   NO PAST SURGERIES      Obstetrical History: OB History     Gravida  3   Para  2   Term  2   Preterm      AB      Living  2      SAB      IAB      Ectopic      Multiple  0   Live Births  2           Social History Social History   Socioeconomic History   Marital status: Single    Spouse name: Ethelene Browns   Number of children: 1   Years of education: Not on file   Highest education level: 12th grade  Occupational History   Not on file  Tobacco Use   Smoking status: Former    Passive exposure: Never   Smokeless  tobacco: Never  Vaping Use   Vaping status: Former  Substance and Sexual Activity   Alcohol use: Not Currently   Drug use: Never   Sexual activity: Yes    Birth control/protection: None  Other Topics Concern   Not on file  Social History Narrative   Not on file   Social Determinants of Health   Financial Resource Strain: Not on File (12/21/2021)   Received from [employer], [employer]    Financial Resource Strain: 0  Food Insecurity: Not on File (12/21/2021)   Received from [place], [employer] Insecurity    Food: 0  Transportation Needs: Not on File (12/21/2021)   Received from [employer], [employer] Needs    Transportation: 0  Physical Activity: Not on File (12/21/2021)   Received from [place], [place]   Physical Activity    Physical Activity: 0  Stress: Not on File (12/21/2021)   Received from [facility], [place]   Stress    Stress: 0  Social Connections: Not on File (12/21/2021)   Received from [place], [place]   Social  Connections    Social Connections and Isolation: 0    Family History: Family History  Problem Relation Age of Onset   Hypertension Mother    Miscarriages / [place] Mother    Hypertension Maternal Grandmother    Hyperlipidemia Maternal Grandmother    ADD / ADHD Neg Hx    Alcohol abuse Neg Hx    Anxiety disorder Neg Hx    Arthritis Neg Hx    Asthma Neg Hx    Birth defects Neg Hx    Cancer Neg Hx    COPD Neg Hx    Depression Neg Hx    Diabetes Neg Hx    Drug abuse Neg Hx    Hearing loss Neg Hx    Heart disease Neg Hx    Intellectual disability Neg Hx    Kidney disease Neg Hx    Learning disabilities Neg Hx    Obesity Neg Hx    Stroke Neg Hx    Vision loss Neg Hx    Varicose Veins Neg Hx     Allergies: Allergies  Allergen Reactions   Shellfish Allergy    Shrimp Extract     Medications Prior to Admission  Medication Sig Dispense Refill Last Dose   Prenat-Fe Poly-Methfol-FA-DHA (VITAFOL ULTRA) 29-0.6-0.4-200 MG CAPS Take 1 capsule by mouth daily before breakfast. 90 capsule 4 04/11/2023   Blood Pressure Monitoring (BLOOD PRESSURE KIT) DEVI 1 kit by Does not apply route once a week. (Patient not taking: Reported on 03/12/2023) 1 each 0    cefadroxil (DURICEF) 500 MG capsule Take 1 capsule (500 mg total) by mouth 2 (two) times daily for 9 days. 18 capsule 0    cyclobenzaprine (FLEXERIL) 5 MG tablet Take 1 tablet (5 mg total) by mouth 3 (three) times daily as needed for muscle spasms. 10 tablet 0      Review of Systems   All systems reviewed and negative except as stated in HPI  Blood pressure 115/77, pulse 91, temperature 98.2 F (36.8 C), temperature source Oral, resp. rate 18, height 5\' 5"  (1.651 m), weight 65.3 kg, last menstrual period 07/04/2022, SpO2 100%. General appearance: alert, cooperative, and appears stated age Lungs: clear to auscultation bilaterally Heart: regular rate and rhythm Abdomen: soft, non-tender; bowel sounds normal Pelvic: No  lesions Extremities: Homans sign is negative, no sign of DVT  Presentation: cephalic by ultrasound Fetal  monitoringBaseline: 130 bpm, Variability: Good {> 6 bpm), Accelerations: Reactive, and Decelerations: Absent Uterine activity every 2 to 3 minutes Dilation: 7 Effacement (%): 80 Station: -1 Exam by:: K.Louis-Charles, Charity fundraiser .   Prenatal labs: ABO, Rh: --/--/PENDING (08/09 1610) Antibody: PENDING (08/09 0035) Rubella: <0.90 (02/08 9604) RPR: Non Reactive (05/24 0917)  HBsAg: Negative (02/08 5409)  HIV: Non Reactive (05/24 0917)  GBS: Negative/-- (07/22 1433)  1 hr Glucola normal Genetic screening low risk Anatomy US EI CF  Prenatal Transfer Tool  Maternal Diabetes: No Genetic Screening: Normal Maternal Ultrasounds/Referrals: Isolated EIF (echogenic intracardiac focus) Fetal Ultrasounds or other Referrals:  Referred to Materal Fetal Medicine  Maternal Substance Abuse:  No Significant Maternal Medications:  None Significant Maternal Lab Results:  Group B Strep negative Number of Prenatal Visits:greater than 3 verified prenatal visits Other Comments:  None  Results for orders placed or performed during the hospital encounter of 04/11/23 (from the past 24 hour(s))  Fern Test   Collection Time: 04/12/23 12:18 AM  Result Value Ref Range   POCT Fern Test Positive = ruptured amniotic membanes   CBC   Collection Time: 04/12/23 12:35 AM  Result Value Ref Range   WBC 10.9 (H) 4.0 - 10.5 K/uL   RBC 4.80 3.87 - 5.11 MIL/uL   Hemoglobin 10.5 (L) 12.0 - 15.0 g/dL   HCT 81.1 (L) 91.4 - 78.2 %   MCV 70.2 (L) 80.0 - 100.0 fL   MCH 21.9 (L) 26.0 - 34.0 pg   MCHC 31.2 30.0 - 36.0 g/dL   RDW 95.6 (H) 21.3 - 08.6 %   Platelets 409 (H) 150 - 400 K/uL   nRBC 0.0 0.0 - 0.2 %  Type and screen   Collection Time: 04/12/23 12:35 AM  Result Value Ref Range   ABO/RH(D) PENDING    Antibody Screen PENDING    Sample Expiration      04/15/2023,2359 Performed at [facility], [address], [place], [place] [address]     Patient Active Problem List   Diagnosis Date Noted   Normal labor 04/12/2023   Echogenic intracardiac focus of fetus on prenatal ultrasound 11/29/2022   Supervision of other normal pregnancy, antepartum 10/03/2022   History of prior pregnancy with IUGR newborn 08/15/2018    Assessment/Plan:  Romaine Rama is a 21 y.o. G3P2002 at [redacted]w[redacted]d here for SROM 2300 8/8  #Labor: Expectant management #Pain: Per patient request #FWB: Category 1 #ID:  GBS negative #MOF: Breast #MOC: Unsure  Mickle Campton Q Mercado-Ortiz, DO  04/12/2023, 1:30 AM

## 2023-04-13 ENCOUNTER — Inpatient Hospital Stay (HOSPITAL_COMMUNITY): Payer: Medicaid Other

## 2023-04-13 ENCOUNTER — Other Ambulatory Visit: Payer: Self-pay

## 2023-04-13 LAB — CBC
HCT: 29 % — ABNORMAL LOW (ref 36.0–46.0)
Hemoglobin: 9 g/dL — ABNORMAL LOW (ref 12.0–15.0)
MCH: 21.9 pg — ABNORMAL LOW (ref 26.0–34.0)
MCHC: 31 g/dL (ref 30.0–36.0)
MCV: 70.6 fL — ABNORMAL LOW (ref 80.0–100.0)
Platelets: 343 10*3/uL (ref 150–400)
RBC: 4.11 MIL/uL (ref 3.87–5.11)
RDW: 18.6 % — ABNORMAL HIGH (ref 11.5–15.5)
WBC: 13.1 10*3/uL — ABNORMAL HIGH (ref 4.0–10.5)
nRBC: 0 % (ref 0.0–0.2)

## 2023-04-13 MED ORDER — FERROUS SULFATE 325 (65 FE) MG PO TABS
325.0000 mg | ORAL_TABLET | ORAL | Status: DC
  Administered 2023-04-13: 325 mg via ORAL
  Filled 2023-04-13: qty 1

## 2023-04-13 MED ORDER — FERROUS SULFATE 325 (65 FE) MG PO TABS: 325.0000 mg | ORAL_TABLET | Freq: Every day | ORAL | 0 refills | Status: DC

## 2023-04-13 MED ORDER — SLYND 4 MG PO TABS: 1.0000 | ORAL_TABLET | Freq: Every day | ORAL | 0 refills | Status: DC

## 2023-04-13 MED ORDER — IBUPROFEN 600 MG PO TABS: 600.0000 mg | ORAL_TABLET | Freq: Four times a day (QID) | ORAL | 0 refills | Status: DC

## 2023-04-13 MED ORDER — ACETAMINOPHEN 325 MG PO TABS: 650.0000 mg | ORAL_TABLET | ORAL | 0 refills | Status: DC | PRN

## 2023-04-13 NOTE — Progress Notes (Signed)
POSTPARTUM PROGRESS NOTE  Post Partum Day 1  Subjective:  Leah Price is a 22 y.o. [id number] s/p VD at [redacted]w[redacted]d.  She reports she is doing well. No acute events overnight. She denies any problems with ambulating, voiding or po intake. Denies nausea or vomiting.  Pain is well controlled.  Lochia is adequate. Pt had one "golf ball sized" blood clot with placental membranes attached. No continued bleeding and no dizziness, shortness of breath, or LOC..  Objective: Blood pressure 107/77, pulse 78, temperature 97.6 F (36.4 C), temperature source Oral, resp. rate 16, height 5\' 5"  (1.651 m), weight 65.3 kg, last menstrual period 07/04/2022, SpO2 100%, unknown if currently breastfeeding.  Physical Exam:  General: alert, cooperative and no distress Chest: no respiratory distress Heart:regular rate, distal pulses intact Uterine Fundus: firm, appropriately tender DVT Evaluation: No calf swelling or tenderness Extremities: no edema Skin: warm, dry  Recent Labs    04/12/23 0511 04/13/23 1016  HGB 9.6* 9.0*  HCT 30.8* 29.0*    Assessment/Plan: Toby Pensyl is a 22 y.o. [id number] s/p VD at [redacted]w[redacted]d   PPD#1 - Doing well  Routine postpartum care Anemia: Asx. Passed blod clot with placental tissue, current stable. Rpt hgb stable. Ferrous sulfate PO started.  Contraception: OCP Feeding: breast Dispo: Plan for discharge tomorrow.   LOS: 1 day   Myrtie Hawk, DO OB Fellow  04/13/2023, 10:31 AM

## 2023-04-13 NOTE — Progress Notes (Signed)
Called to pt room, who reported passing additional clot/tissue. RN examined tissue, was approx 8cm x 4cm and was not the consistency of a typical clot. Appeared to be placental tissue. Saved tissue on pad in bathroom. Dr. Lucianne Muss was notified and orders received. Pt kept NPO pending [place] and CBC results. Postpartum assessment was otherwise WDL.

## 2023-04-13 NOTE — Progress Notes (Addendum)
Patient called RN at 2300 to room to look at large clot that was passed. RN observed a clot the size of a golf ball that was made of tissue with some clotted blood. RN called Dr. Lanae Crumbly to inform her of the observation and the provider reported that she would be coming to bedside to meet with the patient on plan of care. RN followed up with Dr. Lanae Crumbly at 0000 to confirm that she was coming to bedside. She said that she would come as soon as she was out of the deliveries at hand. Contacted Dr. Lanae Crumbly an additional time at 0330 per patient request. Provider at bedside at 0600.

## 2023-04-13 NOTE — Lactation Note (Addendum)
This note was copied from a baby's chart. Lactation Consultation Note  Patient Name: Girl Kristara Tarro [id number] Date: 04/13/2023 Age:24 hours - experienced BF ( see doc flow sheets )  Reason for consult: Follow-up assessment;Early term 37-38.6wks;Infant weight loss (6 % weight loss, experienced BD ,Skin Bili recheck - 8.4 at 35 hours) LC  reviewed the doc flow sheets  Per mom Breast feeding is going well, hearing swallows and denies sore nipples.  LC reviewed BF D/C teaching and the [facility] resources.  LC reviewed sore nipple and engorgement prevention and tx if needed.   Maternal Data Has patient been taught Hand Expression?:  (mom experienced BF , see comment below)  Feeding Mother's Current Feeding Choice: Breast Milk  LATCH Score - 9-10    Interventions: Breast feeding basics reviewed;Education;LC Services brochure  Discharge Discharge Education: Engorgement and breast care;Warning signs for feeding baby Pump: Personal;Manual;DEBP  Consult Status Consult Status: Complete Date: 04/13/23    Kathrin Greathouse 04/13/2023, 1:38 PM

## 2023-04-15 ENCOUNTER — Encounter: Payer: Medicaid Other | Admitting: Obstetrics & Gynecology

## 2023-04-23 ENCOUNTER — Encounter: Payer: Medicaid Other | Admitting: Obstetrics and Gynecology

## 2023-04-27 ENCOUNTER — Inpatient Hospital Stay (HOSPITAL_COMMUNITY): Payer: Medicaid Other

## 2023-05-09 ENCOUNTER — Telehealth (HOSPITAL_COMMUNITY): Payer: Self-pay | Admitting: *Deleted

## 2023-05-09 DIAGNOSIS — Z1331 Encounter for screening for depression: Secondary | ICD-10-CM

## 2023-05-09 NOTE — Telephone Encounter (Signed)
05/09/2023  Name: Leah Price MRN: [id number] DOB: 2000-09-13  Reason for Call:  Transition of Care Hospital Discharge Call  Contact Status: Patient Contact Status: Complete  Language assistant needed: Interpreter Mode: Interpreter Not Needed        Follow-Up Questions: Do You Have Any Concerns About Your Health As You Heal From Delivery?: Yes What Concerns Do You Have About Your Health?: She says she passed a clot that looked like a placenta fragment two weeks ago.  Has not had anymore clots since then, says that lochia is light, and denies fevers or abdominal pain.  We discussed times to call provider including fever, abdominal pain, and normal parameters for lochia. Do You Have Any Concerns About Your Infants Health?: Yes What Concerns Do You Have About Your Baby?: Baby has not stooled in 2 days.  Tonight, she plans to try rectal stimulation with device called "the Elvin So" as well as a warm bath.  If nether of these work, she plans to call pediatrician.  She says baby is passing flatus and has plenty of wet diapers.  Baby is getting only breastmilk.  Encouraged her to call pediatrician if issue does not resolve.  Edinburgh Postnatal Depression Scale:  In the Past 7 Days: I have been able to laugh and see the funny side of things.: As much as I always could I have looked forward with enjoyment to things.: Definitely less than I used to I have blamed myself unnecessarily when things went wrong.: Yes, some of the time I have been anxious or worried for no good reason.: Yes, sometimes I have felt scared or panicky for no good reason.: No, not at all Things have been getting on top of me.: Yes, sometimes I haven't been coping as well as usual I have been so unhappy that I have had difficulty sleeping.: Yes, sometimes I have felt sad or miserable.: Yes, quite often I have been so unhappy that I have been crying.: Only occasionally The thought of harming myself has occurred to me.:  Never Inocente Salles Postnatal Depression Scale Total: (!) 13  PHQ2-9 Depression Scale:     Discharge Follow-up: Edinburgh score requires follow up?: No Patient was advised of the following resources:: Support Group, Breastfeeding Support Group (declines postpartum group information via email)  Post-discharge interventions: Reviewed Newborn Safe Sleep Practices Placed IBH referral in patient's chart  Salena Saner, RN 05/09/2023 16:03

## 2023-05-30 ENCOUNTER — Ambulatory Visit: Payer: Medicaid Other | Admitting: Obstetrics and Gynecology

## 2023-05-30 ENCOUNTER — Encounter: Payer: Self-pay | Admitting: Obstetrics and Gynecology

## 2023-05-30 NOTE — Progress Notes (Signed)
Post Partum Visit Note  Leah Price is a 22 y.o. G52P3003 female who presents for a postpartum visit. She is 6 weeks postpartum following a normal spontaneous vaginal delivery.  I have fully reviewed the prenatal and intrapartum course. The delivery was at 38 gestational weeks.  Anesthesia: none. Postpartum course has been good. Baby is doing well. Baby is feeding by breast. Bleeding no bleeding. Bowel function is normal. Bladder function is normal. Patient is sexually active. Contraception method is OCP (estrogen/progesterone). Postpartum depression screening: positive.   The pregnancy intention screening data noted above was reviewed. Potential methods of contraception were discussed. The patient elected to proceed with No data recorded.   Edinburgh Postnatal Depression Scale - 05/30/23 1319       Edinburgh Postnatal Depression Scale:  In the Past 7 Days   I have been able to laugh and see the funny side of things. 0    I have looked forward with enjoyment to things. 2    I have blamed myself unnecessarily when things went wrong. 0    I have been anxious or worried for no good reason. 2    I have felt scared or panicky for no good reason. 0    Things have been getting on top of me. 3    I have been so unhappy that I have had difficulty sleeping. 3    I have felt sad or miserable. 3    I have been so unhappy that I have been crying. 1    The thought of harming myself has occurred to me. 0    Edinburgh Postnatal Depression Scale Total 14             Health Maintenance Due  Topic Date Due   HPV VACCINES (1 - 3-dose series) Never done   INFLUENZA VACCINE  Never done   COVID-19 Vaccine (1 - 2023-24 season) Never done    The following portions of the patient's history were reviewed and updated as appropriate: allergies, current medications, past family history, past medical history, past social history, past surgical history, and problem list.  Review of  Systems Constitutional: negative Eyes: negative Ears, nose, mouth, throat, and face: negative Respiratory: negative Cardiovascular: negative Gastrointestinal: negative Genitourinary:negative Integument/breast: negative Hematologic/lymphatic: negative Musculoskeletal:negative Neurological: negative Behavioral/Psych: positive for sleep disturbance and feeling overwhelmed at this time while baby is colicky and constipated Endocrine: negative Allergic/Immunologic: negative  Objective:  BP 108/72   Pulse 68   Ht 5\' 5"  (1.651 m)   Wt 124 lb 9.6 oz (56.5 kg)   LMP 07/04/2022   Breastfeeding Yes   BMI 20.73 kg/m    General:  alert, cooperative, and fatigued   Breasts:  not indicated  Lungs: Normal effort  Heart:  regular rate and rhythm, S1, S2 normal, no murmur, click, rub or gallop  Abdomen: soft, non-tender; bowel sounds normal; no masses,  no organomegaly   Wound N/a  GU exam:  not indicated       Assessment:  Encounter for postpartum care of lactating mother Normal postpartum exam.   Plan:   Essential components of care per ACOG recommendations:  1.  Mood and well being: Patient with positive depression screening today. Reviewed local resources for support. Patient declined - stating "I know this will pass. It will all be ok." - Patient tobacco use? No.   - hx of drug use? No.    2. Infant care and feeding:  -Patient currently breastmilk feeding? Yes. Discussed returning  to work and pumping.  -Social determinants of health (SDOH) reviewed in EPIC. No concerns.   3. Sexuality, contraception and birth spacing - Patient does not want a pregnancy in the next year.  Desired family size is 3 children.  - Reviewed reproductive life planning. Reviewed contraceptive methods based on pt preferences and effectiveness.  Patient desired Oral Contraceptive today. She has not picked the Rx sent for [facility] d/t "I forgot. I haven't had time." Slynd samples #2 given. - Discussed birth  spacing of 18 months  4. Sleep and fatigue -Encouraged family/partner/community support of 4 hrs of uninterrupted sleep to help with mood and fatigue  5. Physical Recovery  - Discussed patients delivery and complications. She describes her labor as good. - Patient had a Vaginal, no problems at delivery. Patient had a  small periurethral  laceration. Perineal healing reviewed. Patient expressed understanding - Patient has urinary incontinence? No. - Patient is safe to resume physical and sexual activity  6.  Health Maintenance - HM due items addressed Yes - Last pap smear  Diagnosis  Date Value Ref Range Status  10/11/2022 (A)  Final   - Atypical squamous cells of undetermined significance (ASC-US)   Pap smear not done at today's visit. Not due until 10/2023 >> advised to call to schedule in 08/2023. -Breast Cancer screening indicated? No.   7. Chronic Disease/Pregnancy Condition follow up: None  - PCP follow up  Raelyn Mora, CNM [facility] [employer], [facility]

## 2023-06-11 ENCOUNTER — Encounter: Payer: Self-pay | Admitting: Obstetrics and Gynecology

## 2023-06-11 ENCOUNTER — Other Ambulatory Visit: Payer: Self-pay

## 2023-06-11 MED ORDER — SLYND 4 MG PO TABS: 1.0000 | ORAL_TABLET | Freq: Every day | ORAL | 3 refills | Status: DC

## 2023-07-03 ENCOUNTER — Telehealth: Payer: Self-pay | Admitting: Clinical

## 2023-07-03 NOTE — Telephone Encounter (Signed)
Attempt call regarding referral; Pt declines IBH service at this time; will call Asher Muir at [phone] as needed.

## 2023-12-31 ENCOUNTER — Other Ambulatory Visit: Payer: Self-pay | Admitting: *Deleted

## 2023-12-31 DIAGNOSIS — Z3040 Encounter for surveillance of contraceptives, unspecified: Secondary | ICD-10-CM

## 2023-12-31 MED ORDER — SLYND 4 MG PO TABS
1.0000 | ORAL_TABLET | Freq: Every day | ORAL | 4 refills | Status: DC
Start: 2023-12-31 — End: 2024-01-23

## 2023-12-31 NOTE — Progress Notes (Signed)
 Pt request RX refill OCP. Pt seen in Sept 2024 for postpartum visit. RX refilled thru Sept 2025 when due for annual.

## 2024-01-23 ENCOUNTER — Ambulatory Visit: Admitting: Obstetrics and Gynecology

## 2024-01-23 ENCOUNTER — Encounter: Payer: Self-pay | Admitting: Obstetrics and Gynecology

## 2024-01-23 VITALS — BP 105/73 | HR 82 | Ht 64.0 in | Wt 113.0 lb

## 2024-01-23 DIAGNOSIS — Z3040 Encounter for surveillance of contraceptives, unspecified: Secondary | ICD-10-CM

## 2024-01-23 DIAGNOSIS — Z01419 Encounter for gynecological examination (general) (routine) without abnormal findings: Secondary | ICD-10-CM

## 2024-01-23 DIAGNOSIS — Z1339 Encounter for screening examination for other mental health and behavioral disorders: Secondary | ICD-10-CM | POA: Diagnosis not present

## 2024-01-23 MED ORDER — SLYND 4 MG PO TABS: 1.0000 | ORAL_TABLET | Freq: Every day | ORAL | 11 refills | Status: AC

## 2024-01-23 NOTE — Progress Notes (Signed)
 ANNUAL GYNECOLOGY VISIT Chief Complaint  Patient presents with   Annual Exam     Subjective:  Leah Price is a 23 y.o. [id number] who presents for annual exam  Doing well today. Taking Slynd  for contraception. No periods on this. UPT negative last week at her PCP. Also breastfeeding consistently. 26 month old daughter.  Gyn History: No LMP recorded. Sexually active: yes/no: Yes Contraception: oral progesterone-only contraceptive Last pap:  Lab Results  Component Value Date   DIAGPAP (A) 10/11/2022    - Atypical squamous cells of undetermined significance (ASC-US )   HPVHIGH Negative 10/11/2022  2021 NILM History of abnormal pap: Yes: see above Periods: no periods on pills     The pregnancy intention screening data noted above was reviewed. Potential methods of contraception were discussed. The patient elected to proceed with No data recorded.       01/23/2024    3:15 PM 01/25/2023    8:25 AM 10/03/2022    1:21 PM  Depression screen PHQ 2/9  Decreased Interest 0 2 1  Down, Depressed, Hopeless 0 1 1  PHQ - 2 Score 0 3 2  Altered sleeping  3 1  Tired, decreased energy  3 1  Change in appetite  2 0  Feeling bad or failure about yourself   0 0  Trouble concentrating  0 0  Moving slowly or fidgety/restless  0 0  Suicidal thoughts  0 0  PHQ-9 Score  11 4        01/23/2024    3:15 PM 01/25/2023    8:26 AM 10/03/2022    1:23 PM  GAD 7 : Generalized Anxiety Score  Nervous, Anxious, on Edge 0 1 0  Control/stop worrying 0 0 0  Worry too much - different things 0 0 0  Trouble relaxing 0 3 1  Restless 0 0 0  Easily annoyed or irritable 0 2 1  Afraid - awful might happen 0 3 0  Total GAD 7 Score 0 9 2      OB History     Gravida  3   Para  3   Term  3   Preterm      AB      Living  3      SAB      IAB      Ectopic      Multiple  0   Live Births  3           Past Medical History:  Diagnosis Date   Alpha thalassemia silent carrier  02/19/2020   Chlamydia infection 06/14/2022   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    Past Surgical History:  Procedure Laterality Date   NO PAST SURGERIES      Social History   Socioeconomic History   Marital status: Single    Spouse name: Arnetta Lank   Number of children: 1   Years of education: Not on file   Highest education level: 12th grade  Occupational History   Not on file  Tobacco Use   Smoking status: Former    Passive exposure: Never   Smokeless tobacco: Never  Vaping Use   Vaping status: Former  Substance and Sexual Activity   Alcohol use: Yes    Comment: occ   Drug use: Never   Sexual activity: Yes    Birth control/protection: Pill  Other Topics Concern   Not on file  Social History Narrative   Not on file   Social Drivers  of Health   Financial Resource Strain: Medium Risk (12/29/2023)   Received from [facility]   Overall Financial Resource Strain (CARDIA)    Difficulty of Paying Living Expenses: Somewhat hard  Food Insecurity: Food Insecurity Present (12/29/2023)   Received from [facility]   Hunger Vital Sign    Worried About Running Out of Food in the Last Year: Sometimes true    Ran Out of Food in the Last Year: Sometimes true  Transportation Needs: No Transportation Needs (12/29/2023)   Received from [facility] - Transportation    Lack of Transportation (Medical): No    Lack of Transportation (Non-Medical): No  Physical Activity: Insufficiently Active (12/29/2023)   Received from [facility]   Exercise Vital Sign    Days of Exercise per Week: 1 day    Minutes of Exercise per Session: 10 min  Stress: Stress Concern Present (12/29/2023)   Received from [facility] - Occupational Stress Questionnaire    Feeling of Stress : To some extent  Social Connections: Socially Integrated (12/29/2023)   Received from [facility]   Social Network    How would you rate your social  network (family, work, friends)?: Good participation with social networks    Family History  Problem Relation Age of Onset   Hypertension Mother    Miscarriages / [place] Mother    Hypertension Maternal Grandmother    Hyperlipidemia Maternal Grandmother    ADD / ADHD Neg Hx    Alcohol abuse Neg Hx    Anxiety disorder Neg Hx    Arthritis Neg Hx    Asthma Neg Hx    Birth defects Neg Hx    Cancer Neg Hx    COPD Neg Hx    Depression Neg Hx    Diabetes Neg Hx    Drug abuse Neg Hx    Hearing loss Neg Hx    Heart disease Neg Hx    Intellectual disability Neg Hx    Kidney disease Neg Hx    Learning disabilities Neg Hx    Obesity Neg Hx    Stroke Neg Hx    Vision loss Neg Hx    Varicose Veins Neg Hx     Current Outpatient Medications on File Prior to Visit  Medication Sig Dispense Refill   Drospirenone  (SLYND ) 4 MG TABS Take 1 tablet (4 mg total) by mouth daily. 28 tablet 4   No current facility-administered medications on file prior to visit.    Allergies  Allergen Reactions   Shellfish Allergy    Shrimp Extract      Objective:   Vitals:   01/23/24 1456  BP: 105/73  Pulse: 82  Weight: 113 lb (51.3 kg)  Height: 5\' 4"  (1.626 m)   Physical Examination:   General appearance - well appearing, and in no distress  Mental status - alert, oriented to person, place, and time  Psych:  normal mood and affect  Skin - warm and dry, normal color, no suspicious lesions noted  Chest - effort normal   Heart - normal rate   Neck:  midline trachea, no thyromegaly or nodules  Breasts - declines  Abdomen - soft, nontender, nondistended, no masses or organomegaly  Pelvic -  VULVA: normal appearing vulva with no masses, tenderness or lesions   VAGINA: normal appearing vagina with normal color and discharge, no lesions   CERVIX: normal appearing cervix without discharge or lesions, no CMT  UTERUS:  uterus is felt to be normal size, shape, consistency and nontender   ADNEXA: No  adnexal masses or tenderness noted.  Extremities:  No swelling or varicosities noted  Chaperone present for exam  Assessment and Plan:  1. Well woman exam with routine gynecological exam (Primary) Pap ASCUS/HPV neg 2024 (routine screening, next due 2027) Declines breast exam, currently breastfeeding Refill on POPs Declines STI testing  2. Encounter for refill of prescription for contraception - Drospirenone  (SLYND ) 4 MG TABS; Take 1 tablet (4 mg total) by mouth daily.  Dispense: 28 tablet; Refill: 11    No follow-ups on file.  No future appointments.  Marci Setter, MD, FACOG Obstetrician & Gynecologist, [facility] for [facility], [facility]

## 2024-01-23 NOTE — Progress Notes (Signed)
 Pt is in office for AEX and BC refills. Pt states she hasn't had cycle with use of Slynd . Pt is currently breastfeeding.
